# Patient Record
Sex: Male | Born: 1955
Health system: Southern US, Community
[De-identification: ages and names within clinical notes are randomized; demographics above are authoritative.]

## PROBLEM LIST (undated history)

## (undated) DIAGNOSIS — I10 Essential (primary) hypertension: Secondary | ICD-10-CM

## (undated) DIAGNOSIS — F32A Depression, unspecified: Secondary | ICD-10-CM

## (undated) DIAGNOSIS — I639 Cerebral infarction, unspecified: Secondary | ICD-10-CM

## (undated) DIAGNOSIS — E785 Hyperlipidemia, unspecified: Secondary | ICD-10-CM

## (undated) DIAGNOSIS — F329 Major depressive disorder, single episode, unspecified: Secondary | ICD-10-CM

## (undated) DIAGNOSIS — E039 Hypothyroidism, unspecified: Secondary | ICD-10-CM

## (undated) HISTORY — DX: Hyperlipidemia, unspecified: E78.5

## (undated) HISTORY — DX: Hypothyroidism, unspecified: E03.9

## (undated) HISTORY — PX: BRANCHIAL CLEFT CYST EXCISION: SUR497

## (undated) HISTORY — DX: Cerebral infarction, unspecified: I63.9

## (undated) HISTORY — DX: Essential (primary) hypertension: I10

## (undated) HISTORY — DX: Major depressive disorder, single episode, unspecified: F32.9

## (undated) HISTORY — DX: Depression, unspecified: F32.A

---

## 1998-04-11 ENCOUNTER — Ambulatory Visit (HOSPITAL_COMMUNITY): Admission: RE | Admit: 1998-04-11 | Discharge: 1998-04-11 | Payer: Self-pay | Admitting: Gastroenterology

## 2002-05-25 DIAGNOSIS — I639 Cerebral infarction, unspecified: Secondary | ICD-10-CM

## 2002-05-25 HISTORY — DX: Cerebral infarction, unspecified: I63.9

## 2002-12-18 ENCOUNTER — Encounter: Payer: Self-pay | Admitting: Emergency Medicine

## 2002-12-18 ENCOUNTER — Inpatient Hospital Stay (HOSPITAL_COMMUNITY): Admission: EM | Admit: 2002-12-18 | Discharge: 2002-12-20 | Payer: Self-pay | Admitting: Emergency Medicine

## 2002-12-19 ENCOUNTER — Encounter: Payer: Self-pay | Admitting: Internal Medicine

## 2003-02-27 ENCOUNTER — Encounter: Admission: RE | Admit: 2003-02-27 | Discharge: 2003-02-27 | Payer: Self-pay | Admitting: Internal Medicine

## 2003-04-16 ENCOUNTER — Encounter: Admission: RE | Admit: 2003-04-16 | Discharge: 2003-04-16 | Payer: Self-pay | Admitting: Internal Medicine

## 2003-06-01 ENCOUNTER — Encounter: Admission: RE | Admit: 2003-06-01 | Discharge: 2003-06-01 | Payer: Self-pay | Admitting: Internal Medicine

## 2003-10-04 ENCOUNTER — Encounter: Admission: RE | Admit: 2003-10-04 | Discharge: 2003-10-04 | Payer: Self-pay | Admitting: Internal Medicine

## 2003-10-18 ENCOUNTER — Encounter: Admission: RE | Admit: 2003-10-18 | Discharge: 2003-10-18 | Payer: Self-pay | Admitting: Internal Medicine

## 2003-11-27 ENCOUNTER — Encounter: Admission: RE | Admit: 2003-11-27 | Discharge: 2003-11-27 | Payer: Self-pay | Admitting: Internal Medicine

## 2003-12-04 ENCOUNTER — Encounter: Admission: RE | Admit: 2003-12-04 | Discharge: 2003-12-04 | Payer: Self-pay | Admitting: Internal Medicine

## 2004-06-11 ENCOUNTER — Ambulatory Visit: Payer: Self-pay | Admitting: Internal Medicine

## 2004-06-11 ENCOUNTER — Ambulatory Visit (HOSPITAL_COMMUNITY): Admission: RE | Admit: 2004-06-11 | Discharge: 2004-06-11 | Payer: Self-pay | Admitting: Internal Medicine

## 2004-07-10 ENCOUNTER — Ambulatory Visit: Payer: Self-pay | Admitting: Internal Medicine

## 2004-07-11 ENCOUNTER — Ambulatory Visit: Payer: Self-pay | Admitting: Internal Medicine

## 2004-12-04 ENCOUNTER — Ambulatory Visit: Payer: Self-pay | Admitting: Internal Medicine

## 2005-12-15 ENCOUNTER — Ambulatory Visit: Payer: Self-pay | Admitting: Internal Medicine

## 2006-05-26 DIAGNOSIS — I10 Essential (primary) hypertension: Secondary | ICD-10-CM | POA: Insufficient documentation

## 2006-05-26 DIAGNOSIS — F32A Depression, unspecified: Secondary | ICD-10-CM | POA: Insufficient documentation

## 2006-05-26 DIAGNOSIS — F329 Major depressive disorder, single episode, unspecified: Secondary | ICD-10-CM

## 2006-05-26 DIAGNOSIS — E039 Hypothyroidism, unspecified: Secondary | ICD-10-CM

## 2006-06-11 DIAGNOSIS — E785 Hyperlipidemia, unspecified: Secondary | ICD-10-CM

## 2006-06-30 ENCOUNTER — Telehealth: Payer: Self-pay | Admitting: *Deleted

## 2006-08-17 ENCOUNTER — Ambulatory Visit: Payer: Self-pay | Admitting: Internal Medicine

## 2006-08-17 ENCOUNTER — Encounter (INDEPENDENT_AMBULATORY_CARE_PROVIDER_SITE_OTHER): Payer: Self-pay | Admitting: *Deleted

## 2006-08-17 LAB — CONVERTED CEMR LAB
BUN: 13 mg/dL (ref 6–23)
CO2: 24 meq/L (ref 19–32)
Calcium: 8.6 mg/dL (ref 8.4–10.5)
Creatinine, Ser: 0.72 mg/dL (ref 0.40–1.50)
Sodium: 140 meq/L (ref 135–145)

## 2006-09-02 ENCOUNTER — Telehealth: Payer: Self-pay | Admitting: *Deleted

## 2006-09-16 ENCOUNTER — Ambulatory Visit: Payer: Self-pay | Admitting: Internal Medicine

## 2006-09-16 ENCOUNTER — Encounter (INDEPENDENT_AMBULATORY_CARE_PROVIDER_SITE_OTHER): Payer: Self-pay | Admitting: *Deleted

## 2006-09-16 ENCOUNTER — Telehealth (INDEPENDENT_AMBULATORY_CARE_PROVIDER_SITE_OTHER): Payer: Self-pay | Admitting: *Deleted

## 2006-09-16 LAB — CONVERTED CEMR LAB
Amphetamine Screen, Ur: NEGATIVE
Creatinine,U: 54.6 mg/dL
Marijuana Metabolite: NEGATIVE
Methadone: NEGATIVE
Opiates: NEGATIVE
Phencyclidine (PCP): NEGATIVE
Propoxyphene: NEGATIVE
Specific Gravity, Urine: 1.012 (ref 1.005–1.03)
pH: 6.5 (ref 5.0–8.0)

## 2006-09-30 ENCOUNTER — Encounter (INDEPENDENT_AMBULATORY_CARE_PROVIDER_SITE_OTHER): Payer: Self-pay | Admitting: *Deleted

## 2006-09-30 ENCOUNTER — Ambulatory Visit: Payer: Self-pay | Admitting: Internal Medicine

## 2006-09-30 LAB — CONVERTED CEMR LAB
BUN: 15 mg/dL (ref 6–23)
CO2: 25 meq/L (ref 19–32)
Chloride: 103 meq/L (ref 96–112)
Potassium: 4.4 meq/L (ref 3.5–5.3)

## 2006-10-27 ENCOUNTER — Telehealth: Payer: Self-pay | Admitting: *Deleted

## 2006-12-13 ENCOUNTER — Ambulatory Visit: Payer: Self-pay | Admitting: Hospitalist

## 2006-12-15 ENCOUNTER — Encounter (INDEPENDENT_AMBULATORY_CARE_PROVIDER_SITE_OTHER): Payer: Self-pay | Admitting: Internal Medicine

## 2006-12-16 ENCOUNTER — Telehealth (INDEPENDENT_AMBULATORY_CARE_PROVIDER_SITE_OTHER): Payer: Self-pay | Admitting: Pharmacy Technician

## 2006-12-17 ENCOUNTER — Telehealth (INDEPENDENT_AMBULATORY_CARE_PROVIDER_SITE_OTHER): Payer: Self-pay | Admitting: Internal Medicine

## 2007-03-07 ENCOUNTER — Telehealth: Payer: Self-pay | Admitting: *Deleted

## 2007-03-29 ENCOUNTER — Telehealth (INDEPENDENT_AMBULATORY_CARE_PROVIDER_SITE_OTHER): Payer: Self-pay | Admitting: *Deleted

## 2007-04-05 ENCOUNTER — Ambulatory Visit: Payer: Self-pay | Admitting: *Deleted

## 2007-04-05 DIAGNOSIS — B009 Herpesviral infection, unspecified: Secondary | ICD-10-CM | POA: Insufficient documentation

## 2007-04-05 LAB — CONVERTED CEMR LAB
ALT: 16 units/L (ref 0–53)
AST: 13 units/L (ref 0–37)
Albumin: 4.5 g/dL (ref 3.5–5.2)
Creatinine, Ser: 0.94 mg/dL (ref 0.40–1.50)
Total Protein: 6.6 g/dL (ref 6.0–8.3)

## 2007-10-20 ENCOUNTER — Ambulatory Visit: Payer: Self-pay | Admitting: *Deleted

## 2007-11-01 ENCOUNTER — Ambulatory Visit: Payer: Self-pay | Admitting: *Deleted

## 2007-11-02 ENCOUNTER — Encounter (INDEPENDENT_AMBULATORY_CARE_PROVIDER_SITE_OTHER): Payer: Self-pay | Admitting: *Deleted

## 2007-11-02 LAB — CONVERTED CEMR LAB
Calcium: 8.9 mg/dL (ref 8.4–10.5)
Chlamydia, Swab/Urine, PCR: NEGATIVE
Creatinine, Ser: 0.82 mg/dL (ref 0.40–1.50)
Potassium: 3.9 meq/L (ref 3.5–5.3)

## 2008-06-21 ENCOUNTER — Telehealth: Payer: Self-pay | Admitting: *Deleted

## 2008-07-03 ENCOUNTER — Ambulatory Visit: Payer: Self-pay | Admitting: *Deleted

## 2008-07-03 LAB — CONVERTED CEMR LAB
AST: 13 units/L (ref 0–37)
Alkaline Phosphatase: 71 units/L (ref 39–117)
BUN: 14 mg/dL (ref 6–23)
CO2: 25 meq/L (ref 19–32)
Chloride: 101 meq/L (ref 96–112)
Potassium: 4.7 meq/L (ref 3.5–5.3)
Sodium: 140 meq/L (ref 135–145)
Total Bilirubin: 0.4 mg/dL (ref 0.3–1.2)

## 2008-07-04 ENCOUNTER — Telehealth: Payer: Self-pay | Admitting: Licensed Clinical Social Worker

## 2008-07-09 ENCOUNTER — Ambulatory Visit: Payer: Self-pay | Admitting: *Deleted

## 2008-07-09 ENCOUNTER — Encounter: Payer: Self-pay | Admitting: Licensed Clinical Social Worker

## 2008-07-09 LAB — CONVERTED CEMR LAB
OCCULT 1: NEGATIVE
OCCULT 3: NEGATIVE

## 2008-07-26 ENCOUNTER — Encounter: Payer: Self-pay | Admitting: Licensed Clinical Social Worker

## 2008-09-10 ENCOUNTER — Telehealth: Payer: Self-pay | Admitting: *Deleted

## 2008-10-18 ENCOUNTER — Telehealth (INDEPENDENT_AMBULATORY_CARE_PROVIDER_SITE_OTHER): Payer: Self-pay | Admitting: *Deleted

## 2009-01-17 ENCOUNTER — Telehealth (INDEPENDENT_AMBULATORY_CARE_PROVIDER_SITE_OTHER): Payer: Self-pay | Admitting: Internal Medicine

## 2009-04-22 ENCOUNTER — Telehealth: Payer: Self-pay | Admitting: *Deleted

## 2009-05-08 ENCOUNTER — Telehealth (INDEPENDENT_AMBULATORY_CARE_PROVIDER_SITE_OTHER): Payer: Self-pay | Admitting: *Deleted

## 2009-06-06 ENCOUNTER — Ambulatory Visit: Payer: Self-pay | Admitting: Internal Medicine

## 2009-06-08 LAB — CONVERTED CEMR LAB
Calcium: 9.7 mg/dL (ref 8.4–10.5)
Creatinine, Ser: 0.79 mg/dL (ref 0.40–1.50)
Glucose, Bld: 104 mg/dL — ABNORMAL HIGH (ref 70–99)
Sodium: 143 meq/L (ref 135–145)
TSH: 0.405 microintl units/mL (ref 0.350–4.5)

## 2009-06-17 ENCOUNTER — Telehealth: Payer: Self-pay | Admitting: Internal Medicine

## 2009-06-18 ENCOUNTER — Ambulatory Visit: Payer: Self-pay | Admitting: Internal Medicine

## 2009-06-18 LAB — CONVERTED CEMR LAB: OCCULT 3: NEGATIVE

## 2009-07-12 ENCOUNTER — Ambulatory Visit: Payer: Self-pay | Admitting: Internal Medicine

## 2009-07-22 ENCOUNTER — Telehealth: Payer: Self-pay | Admitting: Internal Medicine

## 2009-07-23 ENCOUNTER — Telehealth: Payer: Self-pay | Admitting: Internal Medicine

## 2009-08-09 ENCOUNTER — Telehealth (INDEPENDENT_AMBULATORY_CARE_PROVIDER_SITE_OTHER): Payer: Self-pay | Admitting: Internal Medicine

## 2009-08-14 ENCOUNTER — Ambulatory Visit: Payer: Self-pay | Admitting: Infectious Diseases

## 2009-09-24 ENCOUNTER — Encounter (INDEPENDENT_AMBULATORY_CARE_PROVIDER_SITE_OTHER): Payer: Self-pay | Admitting: Internal Medicine

## 2009-11-12 ENCOUNTER — Encounter: Payer: Self-pay | Admitting: Internal Medicine

## 2009-12-10 ENCOUNTER — Ambulatory Visit: Payer: Self-pay | Admitting: Internal Medicine

## 2009-12-10 LAB — CONVERTED CEMR LAB

## 2009-12-11 ENCOUNTER — Encounter: Payer: Self-pay | Admitting: Internal Medicine

## 2009-12-11 LAB — CONVERTED CEMR LAB
CO2: 25 meq/L (ref 19–32)
Chloride: 102 meq/L (ref 96–112)
Creatinine, Ser: 0.82 mg/dL (ref 0.40–1.50)
Free T4: 1.32 ng/dL (ref 0.80–1.80)
Potassium: 3.9 meq/L (ref 3.5–5.3)

## 2009-12-13 ENCOUNTER — Encounter: Payer: Self-pay | Admitting: Internal Medicine

## 2009-12-16 ENCOUNTER — Encounter: Payer: Self-pay | Admitting: Internal Medicine

## 2010-05-11 ENCOUNTER — Emergency Department (HOSPITAL_COMMUNITY)
Admission: EM | Admit: 2010-05-11 | Discharge: 2010-05-11 | Payer: Self-pay | Source: Home / Self Care | Admitting: Emergency Medicine

## 2010-05-28 ENCOUNTER — Ambulatory Visit: Admission: RE | Admit: 2010-05-28 | Discharge: 2010-05-28 | Payer: Self-pay | Source: Home / Self Care

## 2010-06-12 ENCOUNTER — Ambulatory Visit: Admission: RE | Admit: 2010-06-12 | Discharge: 2010-06-12 | Payer: Self-pay | Source: Home / Self Care

## 2010-06-12 LAB — CONVERTED CEMR LAB
Cholesterol: 269 mg/dL — ABNORMAL HIGH (ref 0–200)
HDL: 44 mg/dL (ref >39)
Total CHOL/HDL Ratio: 6.1
VLDL: 21 mg/dL (ref 0–40)

## 2010-06-26 NOTE — Progress Notes (Signed)
Summary: medication/gp  Phone Note Outgoing Call   Summary of Call: Received fax from [facility] about pt requesting a lower dose of Fluoxetine.  I called the pt.; he said he thinks 40mg  was too strong -  increased nervousness,decreased sex drive, memory loss and invol. muscle movemt. He said he was taking 20mg .  He does not take it at all now.  And that his therapist was upset with him when he did stopped taking it  and wants him to restart it.  He has an appt. 3/24 and will talk to you then. He's very concerned about memory loss.  Thanks   Initial call taken by: Chinita Pester RN,  August 09, 2009 1:47 PM  Follow-up for Phone Call        I will discuss this with him at his appointment next week. Follow-up by: Brooks Sailors MD,  August 09, 2009 6:17 PM

## 2010-06-26 NOTE — Miscellaneous (Signed)
Summary: adding fluoxetine to medications       Prescriptions: FLUOXETINE HCL 20 MG CAPS (FLUOXETINE HCL) Take 1 tablet by mouth once a day  #30 x 5   Entered and Authorized by:   Bethel Born MD   Signed by:   Bethel Born MD on 11/12/2009   Method used:   Electronically to        [facility] [id number] (retail)       [address]       [place], [place]  [address]       Ph: [phone]       Fax: [phone]   RxID:   [id number]

## 2010-06-26 NOTE — Progress Notes (Signed)
Summary: Refill/gh  Phone Note Refill Request Message from:  Fax from Pharmacy on June 17, 2009 10:53 AM  Refills Requested: Medication #1:  EQ LORATADINE 10 MG  TABS Take 1 tablet by mouth once a day   Last Refilled: 04/28/2009 Last labs were 06/16/2009 and last labs were 06/06/2009.   Method Requested: Electronic Initial call taken by: Angelina Ok RN,  June 17, 2009 10:54 AM    Prescriptions: EQ LORATADINE 10 MG  TABS (LORATADINE) Take 1 tablet by mouth once a day  #30 x 11   Entered and Authorized by:   Blanch Media MD   Signed by:   Blanch Media MD on 06/17/2009   Method used:   Electronically to        [facility] [id number] (retail)       [address] [place], [place]  [address]       Ph: [phone]       Fax: [phone]   RxID:   [id number]

## 2010-06-26 NOTE — Miscellaneous (Signed)
Summary: DDS  DDS   Imported By: Louretta Parma 12/18/2009 15:20:38  _____________________________________________________________________  External Attachment:    Type:   Image     Comment:   External Document
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
## 2010-06-26 NOTE — Progress Notes (Signed)
Summary: med refill/gp  Phone Note Refill Request Message from:  Fax from Pharmacy on August 09, 2009 2:22 PM  Request refill on Lorazepam 0.5mg - take 1 tablet every 8 hrs.as needed for anxiety. #90 Last refill - 02/18/09.   Method Requested: Telephone to Pharmacy Initial call taken by: Chinita Pester RN,  August 09, 2009 2:23 PM  Follow-up for Phone Call        Needs to be called in.  Thanks. Follow-up by: Brooks Sailors MD,  August 09, 2009 6:15 PM  Additional Follow-up for Phone Call Additional follow up Details #1::        Rx called to pharmacy - [facility]. Additional Follow-up by: Chinita Pester RN,  August 12, 2009 9:05 AM    New/Updated Medications: ATIVAN 0.5 MG TABS (LORAZEPAM) Take 1 tablet by mouth every 8 hrs as needed for anxiety Prescriptions: ATIVAN 0.5 MG TABS (LORAZEPAM) Take 1 tablet by mouth every 8 hrs as needed for anxiety  #90 x 0   Entered and Authorized by:   Brooks Sailors MD   Signed by:   Brooks Sailors MD on 08/09/2009   Method used:   Telephoned to ...       [facility] [id number] (retail)       [address] [place], [place]  [address]       Ph: [phone]       Fax: [phone]   RxID:   [id number]

## 2010-06-26 NOTE — Progress Notes (Signed)
Summary: Refill/gh  Phone Note Refill Request Message from:  Fax from Pharmacy on July 22, 2009 9:50 AM  Refills Requested: Medication #1:  EQ LORATADINE 10 MG  TABS Take 1 tablet by mouth once a day   Last Refilled: 04/28/2009 Last OV was 07/12/2009 and last labs were 06-06-2009.   Method Requested: Electronic Initial call taken by: Angelina Ok RN,  July 22, 2009 9:51 AM  Follow-up for Phone Call        Rx denied because it was already re-writtenon 06/17/09 with 11 refills.  Please call [name] and remind him that the medication has already been re-written and that refills are available for his use.  Thank You. Follow-up by: Doneen Poisson MD,  July 23, 2009 9:01 AM  Additional Follow-up for Phone Call Additional follow up Details #1::        Rx denial called/faxed to pharmacy Additional Follow-up by: Angelina Ok RN,  July 23, 2009 11:36 AM     Appended Document: Refill/gh Call to pharmacy.  Refills had been placed on file.  Angelina Ok, RN.  They will do the  refill for pt.  Angelina Ok, RN July 23, 2009 11:56 AM

## 2010-06-26 NOTE — Assessment & Plan Note (Signed)
Summary: est-2 wk recheck/ch   Vital Signs:  Patient profile:   55 year old male Height:      68 inches (172.72 cm) Weight:      228.2 pounds (103.73 kg) BMI:     34.82 Temp:     97.2 degrees F (36.22 degrees C) oral Pulse rate:   64 / minute BP sitting:   123 / 80  (left arm)  Vitals Entered By: Stanton Kidney Ditzler RN (June 12, 2010 8:32 AM) Is Patient Diabetic? No Pain Assessment Patient in pain? yes     Location: jointssharp Intensity: 3 Onset of pain  long time Nutritional Status BMI of > 30 = obese Nutritional Status Detail appetite good  Have you ever been in a relationship where you felt threatened, hurt or afraid?denies   Does patient need assistance? Functional Status Self care Ambulation Normal Comments Fasting for labs, no chol med at pharmacy, invol muscle movement - getting worse,occ pain upper left chest and left arm, bump left side of head, weak, tired and confused.   Primary Care Provider:  Bethel Born MD   History of Present Illness: 55 y/o m comes to the clinic for HTN, HLD, Hypothyrodism comes for follow up  He was in the ED 1 monht agofor blurry vision and high blood preassure. He was not given any medication aat that time. He has not had any furhter symtpms. Was seen in the clinic after that  HTN- well controlled on current regimen. No episdes of dizziness, light headed ness or hypotension. complaint with current medication. no perceived side effects at this time.    Hypothyrodism- compliant with meds. no fatigue, cold intolerence, etc    Depression History:      The patient denies a depressed mood most of the day and a diminished interest in his usual daily activities.         Preventive Screening-Counseling & Management  Alcohol-Tobacco     Alcohol drinks/day: 0     Smoking Status: quit     Year Quit: 22 years ago  Caffeine-Diet-Exercise     Does Patient Exercise: no  Current Medications (verified): 1)  Synthroid 100 Mcg Tabs  (Levothyroxine Sodium) .... Take 1 Tablet By Mouth Once A Day 2)  Study Drug - Niacin Vs Placebo 3)  Hydrochlorothiazide 12.5 Mg  Tabs (Hydrochlorothiazide) .... Take 1 Tablet By Mouth Once A Day 4)  Aspirin 81 Mg Chew (Aspirin) .... Take 1 Tablet By Mouth Once A Day 5)  Nasonex 50 Mcg/act  Susp (Mometasone Furoate) .... Use 2 Sprays Ins Each Nostril Once A Day. 6)  Eq Loratadine 10 Mg  Tabs (Loratadine) .... Take 1 Tablet By Mouth Once A Day 7)  Ativan 0.5 Mg Tabs (Lorazepam) .... Take 1 Tablet By Mouth Every 8 Hrs As Needed For Anxiety 8)  Prozac 20 Mg Caps (Fluoxetine Hcl) .... Take 1 Tab By Mouth At Bedtime 9)  Meloxicam 7.5 Mg Tabs (Meloxicam) .... Take 1 Tablet By Mouth Once A Day 10)  Lipitor 20 Mg Tabs (Atorvastatin Calcium) .... Take 1 Tablet By Mouth Once A Day  Allergies: 1)  Novocain  Review of Systems  The patient denies anorexia, fever, weight loss, weight gain, vision loss, decreased hearing, hoarseness, chest pain, syncope, dyspnea on exertion, peripheral edema, prolonged cough, headaches, hemoptysis, abdominal pain, melena, hematochezia, severe indigestion/heartburn, hematuria, incontinence, genital sores, muscle weakness, suspicious skin lesions, transient blindness, difficulty walking, depression, unusual weight change, abnormal bleeding, enlarged lymph nodes, angioedema, breast masses, and testicular  masses.    Physical Exam  General:  Gen: VS reveiwed, Alert, well developed, nodistress ENT: mucous membranes pink & moist. No abnormal finds in ear and nose. CVC:S1 S2 , no murmurs, no abnormal heart sounds. Lungs: Clear to auscultation B/L. No wheezes, crackles or other abnormal sounds Abdomen: soft, non distended, no tender. Normal Bowel sounds EXT: no pitting edema, no engorged veins, Pulsations normal  Neuro:alert, oriented *3, cranial nerved 2-12 intact, strenght normal in all  extremities, senstations normal to light touch.      Impression &  Recommendations:  Problem # 1:  TRIGGER FINGER (ICD-727.03) improving with mobic. has not been to orthopedic, wants to continue with mobic for now.   Problem # 2:  HYPERLIPIDEMIA (ICD-272.4)  medications changes at last visit, but has not acutually started taking lipitor yet.  will check FLP todayand repeat in 3 mnhts  His updated medication list for this problem includes:    Lipitor 20 Mg Tabs (Atorvastatin calcium) .Marland Kitchen... Take 1 tablet by mouth once a day  Labs Reviewed: SGOT: 13 (07/03/2008)   SGPT: 13 (07/03/2008)  Prior 10 Yr Risk Heart Disease: Not enough information (08/17/2006)  Orders: T-Lipid Profile ([id number])  Problem # 3:  HYPOTHYROIDISM (ICD-244.9) controlled  His updated medication list for this problem includes:    Synthroid 100 Mcg Tabs (Levothyroxine sodium) .Marland Kitchen... Take 1 tablet by mouth once a day  Labs Reviewed: TSH: 1.341 (12/11/2009)     Problem # 4:  HYPERTENSION (ICD-401.9) well controlled. continue  His updated medication list for this problem includes:    Hydrochlorothiazide 12.5 Mg Tabs (Hydrochlorothiazide) .Marland Kitchen... Take 1 tablet by mouth once a day  BP today: 123/80 Prior BP: 122/78 (05/28/2010)  Prior 10 Yr Risk Heart Disease: Not enough information (08/17/2006)  Labs Reviewed: K+: 3.9 (12/11/2009) Creat: : 0.82 (12/11/2009)     Problem # 5:  DEPRESSION (ICD-311) well controlled with prozac. has a psychologist.  The following medications were removed from the medication list:    Fluoxetine Hcl 20 Mg Caps (Fluoxetine hcl) .Marland Kitchen... Take 1 tablet by mouth once a day His updated medication list for this problem includes:    Ativan 0.5 Mg Tabs (Lorazepam) .Marland Kitchen... Take 1 tablet by mouth every 8 hrs as needed for anxiety    Prozac 20 Mg Caps (Fluoxetine hcl) .Marland Kitchen... Take 1 tab by mouth at bedtime  Complete Medication List: 1)  Synthroid 100 Mcg Tabs (Levothyroxine sodium) .... Take 1 tablet by mouth once a day 2)  Study Drug - Niacin Vs Placebo   3)  Hydrochlorothiazide 12.5 Mg Tabs (Hydrochlorothiazide) .... Take 1 tablet by mouth once a day 4)  Aspirin 81 Mg Chew (Aspirin) .... Take 1 tablet by mouth once a day 5)  Nasonex 50 Mcg/act Susp (Mometasone furoate) .... Use 2 sprays ins each nostril once a day. 6)  Eq Loratadine 10 Mg Tabs (Loratadine) .... Take 1 tablet by mouth once a day 7)  Ativan 0.5 Mg Tabs (Lorazepam) .... Take 1 tablet by mouth every 8 hrs as needed for anxiety 8)  Prozac 20 Mg Caps (Fluoxetine hcl) .... Take 1 tab by mouth at bedtime 9)  Meloxicam 7.5 Mg Tabs (Meloxicam) .... Take 1 tablet by mouth once a day 10)  Lipitor 20 Mg Tabs (Atorvastatin calcium) .... Take 1 tablet by mouth once a day  Patient Instructions: 1)  Please schedule a follow-up appointment in 6 months. Come fasting at that.  Prescriptions: LIPITOR 20 MG TABS (ATORVASTATIN CALCIUM) Take 1 tablet  by mouth once a day  #30 x 1   Entered and Authorized by:   Bethel Born MD   Signed by:   Bethel Born MD on 06/12/2010   Method used:   Electronically to        [facility] [id number] (retail)       [address]       [place], [place]  [address]       Ph: [phone]       Fax: [phone]   RxID:   [id number]    Orders Added: 1)  Est. Patient Level IV [63875] 2)  T-Lipid Profile [id number]   Process Orders Check Orders Results:     [facility]: ABN not required for this insurance Tests Sent for requisitioning (June 12, 2010 2:01 PM):     06/12/2010: [facility] -- T-Lipid Profile [id number] (signed)     Prevention & Chronic Care Immunizations   Influenza vaccine: Not documented   Influenza vaccine deferral: Refused  (07/12/2009)    Tetanus booster: Not documented   Td booster deferral: Refused  (12/10/2009)    Pneumococcal vaccine: Not documented   Pneumococcal vaccine deferral: Refused  (12/10/2009)  Colorectal Screening   Hemoccult: Not  documented   Hemoccult action/deferral: Ordered  (06/06/2009)    Colonoscopy: Not documented   Colonoscopy action/deferral: Refused  (06/12/2010)  Other Screening   PSA: Not documented   PSA action/deferral: Discussion deferred  (12/10/2009)   Smoking status: quit  (06/12/2010)  Lipids   Total Cholesterol: Not documented   Lipid panel action/deferral: Deferred   LDL: Not documented   LDL Direct: Not documented   HDL: Not documented   Triglycerides: Not documented    SGOT (AST): 13  (07/03/2008)   SGPT (ALT): 13  (07/03/2008)   Alkaline phosphatase: 71  (07/03/2008)   Total bilirubin: 0.4  (07/03/2008)    Lipid flowsheet reviewed?: Yes   Progress toward LDL goal: Unchanged  Hypertension   Last Blood Pressure: 123 / 80  (06/12/2010)   Serum creatinine: 0.82  (12/11/2009)   BMP action: Ordered   Serum potassium 3.9  (12/11/2009)    Hypertension flowsheet reviewed?: Yes   Progress toward BP goal: At goal  Self-Management Support :   Personal Goals (by the next clinic visit) :      Personal blood pressure goal: 140/90  (07/12/2009)     Personal LDL goal: 100  (07/12/2009)    Patient will work on the following items until the next clinic visit to reach self-care goals:     Medications and monitoring: take my medicines every day, check my blood pressure, bring all of my medications to every visit, weigh myself weekly  (06/12/2010)     Eating: eat more vegetables, use fresh or frozen vegetables, eat foods that are low in salt, eat baked foods instead of fried foods, eat fruit for snacks and desserts, limit or avoid alcohol  (06/12/2010)     Activity: take a 30 minute walk every day, park at the far end of the parking lot  (06/12/2010)     Other: working in yard  (06/06/2009)    Hypertension self-management support: Written self-care plan, Education handout, Resources for patients handout  (06/12/2010)   Hypertension self-care plan printed.   Hypertension education handout  printed    Lipid self-management support: Written self-care plan, Education handout, Resources for patients handout  (06/12/2010)   Lipid self-care plan printed.  Lipid education handout printed      Resource handout printed.

## 2010-06-26 NOTE — Progress Notes (Signed)
Summary: Refill/gh  Phone Note Refill Request Message from:  Fax from Pharmacy on July 23, 2009 11:37 AM  Refills Requested: Medication #1:  SYNTHROID 100 MCG TABS Take 1 tablet by mouth once a day   Dosage confirmed as above?Dosage Confirmed   Supply Requested: 1 year   Last Refilled: 06/15/2009 Last labs were  06/06/2009.  Last office vist was 07/12/2009   Method Requested: Electronic Initial call taken by: Angelina Ok RN,  July 23, 2009 11:38 AM    Prescriptions: SYNTHROID 100 MCG TABS (LEVOTHYROXINE SODIUM) Take 1 tablet by mouth once a day  #30 x 11   Entered and Authorized by:   Doneen Poisson MD   Signed by:   Doneen Poisson MD on 07/23/2009   Method used:   Electronically to        [facility] [id number] (retail)       [address] [place], [place]  [address]       Ph: [phone]       Fax: [phone]   RxID:   [id number]

## 2010-06-26 NOTE — Assessment & Plan Note (Signed)
Summary: RA/NEEDS ROUTINE CHECKUP/CH   Vital Signs:  Patient profile:   55 year old male Height:      68 inches (172.72 cm) Weight:      220.9 pounds (100.41 kg) BMI:     33.71 Temp:     98.4 degrees F (36.89 degrees C) oral Pulse rate:   62 / minute BP sitting:   124 / 80  (right arm) Cuff size:   large  Vitals Entered By: Cynda Familia Duncan Dull) (December 10, 2009 2:17 PM) CC: pt c/o right hand/thumb pain x , fingers getting "stuck" on left hand, "sinus HA" x 3 days, Depression Is Patient Diabetic? No Pain Assessment Patient in pain? yes     Location: right thumb Intensity: varies Type: sharp Onset of pain  constant x Nutritional Status BMI of > 30 = obese  Have you ever been in a relationship where you felt threatened, hurt or afraid?No   Does patient need assistance? Functional Status Self care Ambulation Normal   Primary Care Provider:  Bethel Born MD  CC:  pt c/o right hand/thumb pain x , fingers getting "stuck" on left hand, "sinus HA" x 3 days, and Depression.  History of Present Illness: 55 y/o man with PMH of anxiety (on ativan),depression (on prozac), , HTN and HLD and hypothyroidism comes for a routine follow up visit  Complains of pain in the right thumb base on opposition of thumb. also some swelling. reduced rang of motion of the thumb on the right. Going on since 1 month. no meds taken yet. no similar complaints in past. has some pain in the pip joints of the other hand as well.   Depression History:      The patient is having a depressed mood most of the day but denies diminished interest in his usual daily activities.        Suicide risk questions reveal that he does not feel like life is worth living, he wishes that he were dead, and he has thought about ending his life.        Comments:  Pt states he has thought about ending his life his entire life, but it's not something he is going to do today.   Preventive Screening-Counseling &  Management  Alcohol-Tobacco     Smoking Status: quit     Year Quit: 22 years ago  Current Medications (verified): 1)  Synthroid 100 Mcg Tabs (Levothyroxine Sodium) .... Take 1 Tablet By Mouth Once A Day 2)  Zocor 40 Mg Tabs (Simvastatin) .... Take 1 Tablet By Mouth Once A Day 3)  Study Drug - Niacin Vs Placebo 4)  Hydrochlorothiazide 12.5 Mg  Tabs (Hydrochlorothiazide) .... Take 1 Tablet By Mouth Once A Day 5)  Aspirin Buffered 325 Mg  Tabs (Aspirin Buff(Mgcarb-Alaminoac)) .... Take 1 Tablet By Mouth Once A Day 6)  Nasonex 50 Mcg/act  Susp (Mometasone Furoate) .... Use 2 Sprays Ins Each Nostril Once A Day. 7)  Eq Loratadine 10 Mg  Tabs (Loratadine) .... Take 1 Tablet By Mouth Once A Day 8)  Carbamazepine 200 Mg Tabs (Carbamazepine) .... Take 1 Tablet By Mouth Two Times A Day 9)  Ativan 0.5 Mg Tabs (Lorazepam) .... Take 1 Tablet By Mouth Every 8 Hrs As Needed For Anxiety 10)  Prozac 20 Mg Caps (Fluoxetine Hcl) .... Take 1 Tab By Mouth At Bedtime 11)  Fluoxetine Hcl 20 Mg Caps (Fluoxetine Hcl) .... Take 1 Tablet By Mouth Once A Day 12)  Ibuprofen  400 Mg Tabs (Ibuprofen) .... Take 1 Tablet By Mouth Two Times A Day For 1 Week, Then As Needed  Allergies: 1)  Novocain  Review of Systems  The patient denies anorexia, fever, weight loss, weight gain, vision loss, decreased hearing, hoarseness, chest pain, syncope, dyspnea on exertion, peripheral edema, prolonged cough, headaches, hemoptysis, abdominal pain, melena, hematochezia, severe indigestion/heartburn, hematuria, incontinence, genital sores, muscle weakness, suspicious skin lesions, transient blindness, difficulty walking, depression, unusual weight change, abnormal bleeding, enlarged lymph nodes, angioedema, breast masses, and testicular masses.    Physical Exam  General:  alert, well-developed, well-nourished, and well-hydrated.   Head:  normocephalic and atraumatic.   Eyes:  vision grossly intact, pupils equal, pupils round, and  pupils reactive to light.   Ears:  R ear normal and L ear normal.   Nose:  no external deformity.   Mouth:  good dentition and no gingival abnormalities.   Neck:  supple, full ROM, and no masses.   Chest Wall:  no deformities and no tenderness.   Lungs:  normal respiratory effort, no intercostal retractions, no accessory muscle use, normal breath sounds, no dullness, no crackles, and no wheezes.   Heart:  normal rate, regular rhythm, no murmur, no gallop, and no rub.   Abdomen:  soft, non-tender, normal bowel sounds, no distention, and no masses.   Msk:  Right thenar eminence looks a little swollen compared to the other side, ttp on the thenar eminence, unable to oppose the thum towards the little finger,no redness or warmth or other signs of inflammation, no other joints or neurovascular component affected,  elft hand looks normal on PE Pulses:  dorsalis pedis pulses normal bilaterally  Extremities:  no edema Neurologic:  OrientedX3, cranial nerver 2-12 intact,strength good in all extremities, sensations normal to light touch, reflexes 2+ b/l, gait normal    Impression & Recommendations:  Problem # 1:  THUMB PAIN, RIGHT (ICD-729.5) No pain in anantomical snuff box and no inflammatory signs. Will treat as tendonitis with thuumb spica and NSAIDS for 2 weeks and reassess. IF still not better, will refer to [facility] or get furhter imaging.   Problem # 2:  HYPERLIPIDEMIA (ICD-272.4) We are not checkign FLP as patient gets it checked at [facility] where he is enrolled in a study trial.   His updated medication list for this problem includes:    Zocor 40 Mg Tabs (Simvastatin) .Marland Kitchen... Take 1 tablet by mouth once a day  Problem # 3:  HYPOTHYROIDISM (ICD-244.9) will recheck TSH and free T4 His updated medication list for this problem includes:    Synthroid 100 Mcg Tabs (Levothyroxine sodium) .Marland Kitchen... Take 1 tablet by mouth once a day  Orders: T-TSH ([id number]) T-T4, Free [phone])  Labs  Reviewed: TSH: 0.405 (06/06/2009)     Problem # 4:  HYPERTENSION (ICD-401.9) controlled.   His updated medication list for this problem includes:    Hydrochlorothiazide 12.5 Mg Tabs (Hydrochlorothiazide) .Marland Kitchen... Take 1 tablet by mouth once a day  Orders: T-Basic Metabolic Panel [id number])  BP today: 124/80 Prior BP: 110/72 (08/14/2009)  Prior 10 Yr Risk Heart Disease: Not enough information (08/17/2006)  Labs Reviewed: K+: 4.0 (06/06/2009) Creat: : 0.79 (06/06/2009)     Problem # 5:  DEPRESSION (ICD-311) stable on current meds.  His updated medication list for this problem includes:    Ativan 0.5 Mg Tabs (Lorazepam) .Marland Kitchen... Take 1 tablet by mouth every 8 hrs as needed for anxiety    Prozac 20 Mg Caps (Fluoxetine hcl) .Marland Kitchen... Take  1 tab by mouth at bedtime    Fluoxetine Hcl 20 Mg Caps (Fluoxetine hcl) .Marland Kitchen... Take 1 tablet by mouth once a day  Problem # 6:  Preventive Health Care (ICD-V70.0) REfusing all [facility] aspects of care due to pain. will reempasize at next visit.   Complete Medication List: 1)  Synthroid 100 Mcg Tabs (Levothyroxine sodium) .... Take 1 tablet by mouth once a day 2)  Zocor 40 Mg Tabs (Simvastatin) .... Take 1 tablet by mouth once a day 3)  Study Drug - Niacin Vs Placebo  4)  Hydrochlorothiazide 12.5 Mg Tabs (Hydrochlorothiazide) .... Take 1 tablet by mouth once a day 5)  Aspirin Buffered 325 Mg Tabs (Aspirin buff(mgcarb-alaminoac)) .... Take 1 tablet by mouth once a day 6)  Nasonex 50 Mcg/act Susp (Mometasone furoate) .... Use 2 sprays ins each nostril once a day. 7)  Eq Loratadine 10 Mg Tabs (Loratadine) .... Take 1 tablet by mouth once a day 8)  Carbamazepine 200 Mg Tabs (Carbamazepine) .... Take 1 tablet by mouth two times a day 9)  Ativan 0.5 Mg Tabs (Lorazepam) .... Take 1 tablet by mouth every 8 hrs as needed for anxiety 10)  Prozac 20 Mg Caps (Fluoxetine hcl) .... Take 1 tab by mouth at bedtime 11)  Fluoxetine Hcl 20 Mg Caps (Fluoxetine hcl) .... Take 1  tablet by mouth once a day 12)  Ibuprofen 400 Mg Tabs (Ibuprofen) .... Take 1 tablet by mouth two times a day for 1 week, then as needed  Patient Instructions: 1)  Please schedule a follow-up appointment in 3 months. Come earlier (2 weeks) if your pain is not releived with the current medicines.  Prescriptions: IBUPROFEN 400 MG TABS (IBUPROFEN) Take 1 tablet by mouth two times a day for 1 week, then as needed  #30 x 0   Entered and Authorized by:   Bethel Born MD   Signed by:   Bethel Born MD on 12/10/2009   Method used:   Electronically to        [facility] [id number] (retail)       [address]       [place], [place]  [address]       Ph: [phone]       Fax: [phone]   RxID:   [id number]  Process Orders Check Orders Results:     [facility]: ABN not required for this insurance Tests Sent for requisitioning (December 10, 2009 7:49 PM):     12/10/2009: [facility] -- T-Basic Metabolic Panel [phone] (signed)     12/10/2009: [facility] -- T-TSH [phone] (signed)     12/10/2009: [facility] -- T-T4, [place] [[id number]] (signed)    Prevention & Chronic Care Immunizations   Influenza vaccine: Not documented   Influenza vaccine deferral: Refused  (07/12/2009)    Tetanus booster: Not documented   Td booster deferral: Refused  (12/10/2009)    Pneumococcal vaccine: Not documented   Pneumococcal vaccine deferral: Refused  (12/10/2009)  Colorectal Screening   Hemoccult: Not documented   Hemoccult action/deferral: Ordered  (06/06/2009)    Colonoscopy: Not documented   Colonoscopy action/deferral: GI referral  (12/10/2009)  Other Screening   PSA: Not documented   PSA action/deferral: Discussion deferred  (12/10/2009)  Reports requested:   Last colonoscopy report requested.  Smoking status: quit  (12/10/2009)  Lipids   Total Cholesterol: Not documented   Lipid  panel action/deferral: Deferred  LDL: Not documented   LDL Direct: Not documented   HDL: Not documented   Triglycerides: Not documented    SGOT (AST): 13  (07/03/2008)   SGPT (ALT): 13  (07/03/2008)   Alkaline phosphatase: 71  (07/03/2008)   Total bilirubin: 0.4  (07/03/2008)    Lipid flowsheet reviewed?: Yes   Progress toward LDL goal: Unchanged  Hypertension   Last Blood Pressure: 124 / 80  (12/10/2009)   Serum creatinine: 0.79  (06/06/2009)   BMP action: Ordered   Serum potassium 4.0  (06/06/2009)    Hypertension flowsheet reviewed?: Yes   Progress toward BP goal: At goal  Self-Management Support :   Personal Goals (by the next clinic visit) :      Personal blood pressure goal: 140/90  (07/12/2009)     Personal LDL goal: 100  (07/12/2009)    Hypertension self-management support: Written self-care plan, Education handout, Resources for patients handout  (08/14/2009)    Lipid self-management support: Written self-care plan, Education handout, Resources for patients handout  (08/14/2009)    Nursing Instructions: Request report of last colonoscopy

## 2010-06-26 NOTE — Assessment & Plan Note (Signed)
Summary: REASSIGNED NEW TO DR/CFB   Vital Signs:  Patient profile:   55 year old male Height:      68 inches Weight:      219.0 pounds BMI:     33.42 Temp:     97.6 degrees F oral Pulse rate:   79 / minute BP sitting:   131 / 77  (right arm)  Vitals Entered By: Filomena Jungling NT II (June 06, 2009 2:13 PM) CC: pain in jaw, Depression Is Patient Diabetic? No Pain Assessment Patient in pain? yes     Location: jaw Intensity: 9 Type: aching Nutritional Status BMI of > 30 = obese  Have you ever been in a relationship where you felt threatened, hurt or afraid?No   Does patient need assistance? Functional Status Self care Ambulation Normal   Primary Care Provider:  Manning Charity MD  CC:  pain in jaw and Depression.  History of Present Illness: Pt is a 55 yo M with HLD, h/o TIA, hypothyroidism, HTN, Depression and anxiety who is here for f/u.  He has been feeling well.  He has a pain in his jaw at the left joint.  This happens when he opens his mouth wide. The pain is sharp. It is an 8/10 at its worst.  He has lost teeth on that side.  No drainage.  No fevers.  Has not taken any meds for this pain.  Pain is described as sharp, it is worse with eating drinking cold things. He is taking HCTZ for his BP. He reports that his mood has been very good lately.  He denies SI.  He has PTSD since he was a teen.  He is feeling better off of fluoxetine which gave him some sexual side effects.  These have resolved off of this medicine.  He has had an occasional panic attack.  He stopped lorazepam as well because he thought it was making him feel worse. He denies any symptoms of hypo or hyperthyroidism. He takes his synthroid regularly. He is willing to do stool cards.    Depression History:      The patient denies a depressed mood most of the day and a diminished interest in his usual daily activities.         Preventive Screening-Counseling & Management  Alcohol-Tobacco     Smoking  Status: quit     Year Quit: 22 years ago  Caffeine-Diet-Exercise     Does Patient Exercise: no  Current Medications (verified): 1)  Synthroid 100 Mcg Tabs (Levothyroxine Sodium) .... Take 1 Tablet By Mouth Once A Day 2)  Zocor 40 Mg Tabs (Simvastatin) .... Take 1 Tablet By Mouth Once A Day 3)  Study Drug - Niacin Vs Placebo 4)  Hydrochlorothiazide 12.5 Mg  Tabs (Hydrochlorothiazide) .... Take 1 Tablet By Mouth Once A Day 5)  Aspirin Buffered 325 Mg  Tabs (Aspirin Buff(Mgcarb-Alaminoac)) .... Take 1 Tablet By Mouth Once A Day 6)  Nasonex 50 Mcg/act  Susp (Mometasone Furoate) .... Use 2 Sprays Ins Each Nostril Once A Day. 7)  Eq Loratadine 10 Mg  Tabs (Loratadine) .... 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   Quit smoking  ~25 yrs ago.    Review of Systems  The patient denies anorexia, fever, weight loss, vision loss, decreased hearing, chest pain,  syncope, dyspnea on exertion, peripheral edema, prolonged cough, abdominal pain, melena, hematochezia, muscle weakness, and abnormal bleeding.    Physical Exam  General:  alert, well-developed, well-nourished, and well-hydrated.   Head:  normocephalic and atraumatic.   Eyes:  vision grossly intact, pupils equal, pupils round, pupils reactive to light, and pupils react to accomodation.   Mouth:  poor dentition.  no drainage, limited range of motion secondary to pain in right TMJ Neck:  supple and no masses.   Lungs:  normal respiratory effort, no crackles, and no wheezes.   Heart:  normal rate, regular rhythm, no murmur, no gallop, no rub, and no JVD.   Abdomen:  soft, non-tender, normal bowel sounds, no distention, no masses, no guarding, no rigidity, and no rebound tenderness.   Extremities:  trace edema bilaterally Neurologic:  alert & oriented X3,  cranial nerves II-XII intact, strength normal in all extremities, sensation intact to light touch, gait normal, DTRs symmetrical and normal, and finger-to-nose normal.   Skin:  no rashes.   Psych:  Oriented X3, memory intact for recent and remote, normally interactive, good eye contact, not anxious appearing, and not depressed appearing.     Impression & Recommendations:  Problem # 1:  HYPERTENSION (ICD-401.9) BP is at goal on current regimen.  If elevated in the future would begin with increasing dose of HCTZ.  WIll check renal function and K. His updated medication list for this problem includes:    Hydrochlorothiazide 12.5 Mg Tabs (Hydrochlorothiazide) .Marland Kitchen... Take 1 tablet by mouth once a day  Orders: T-Basic Metabolic Panel [id number])  BP today: 131/77 Prior BP: 140/85 (07/03/2008)  Prior 10 Yr Risk Heart Disease: Not enough information (08/17/2006)  Labs Reviewed: K+: 4.7 (07/03/2008) Creat: : 0.78 (07/03/2008)     Problem # 2:  HYPERLIPIDEMIA (ICD-272.4) Pt is in a research study at [facility] and so we do not check his lipids.  He is taking zocor + niacin vs. placebo. His updated medication list for this problem includes:    Zocor 40 Mg Tabs (Simvastatin) .Marland Kitchen... Take 1 tablet by mouth once a day  Labs Reviewed: SGOT: 13 (07/03/2008)   SGPT: 13 (07/03/2008)  Prior 10 Yr Risk Heart Disease: Not enough information (08/17/2006)  Problem # 3:  DEPRESSION (ICD-311) Pt reports that he stopped taking Prozac because of sexual dysfunction.  These symptoms resolved off of Prozac and he reports that his mood has been good lately, better than when he was on meds.  He denies SI/HI. The following medications were removed from the medication list:    Fluoxetine Hcl 40 Mg Caps (Fluoxetine hcl) .Marland Kitchen... Take 1 tablet by mouth once a day    Ativan 0.5 Mg Tabs (Lorazepam) .Marland Kitchen... Take 1 tablet by mouth every eight hours as needed for anxiety  Problem # 4:  ANXIETY (ICD-300.00) Pt has rarely been  anxious lately and says that he feels better not taking Ativan so he stopped it. The following medications were removed from the medication list:    Fluoxetine Hcl 40 Mg Caps (Fluoxetine hcl) .Marland Kitchen... Take 1 tablet by mouth once a day    Ativan 0.5 Mg Tabs (Lorazepam) .Marland Kitchen... Take 1 tablet by mouth every eight hours as needed for anxiety  Problem # 5:  JAW PAIN (ICD-784.92) His pain is most likely either related to his poor dentition or possibly to trigeminal neuralgia.  I would like him to see a dentist although he admits that this will be difficult.  To support trigeminal neuralgia, the  pain is worse with cold foods and it is sharp and fleeting.  We will start with empiric treatment of a possible infectious source given his poor dentition.  If this does not help his pain trigeminal neuralgia moves much higher in the differential and I would start treating with carbamazepime 200mg  by mouth two times a day.  Problem # 6:  HYPOTHYROIDISM (ICD-244.9) No symptoms of hypo or hyperthyroid lately, although pt does have some trace to 1+ pitting pretibial edema, which he says is chronic.  I will check a TSH and alter dose of synthroid as needed. His updated medication list for this problem includes:    Synthroid 100 Mcg Tabs (Levothyroxine sodium) .Marland Kitchen... Take 1 tablet by mouth once a day  Orders: T-TSH ([id number])  Problem # 7:  Preventive Health Care (ICD-V70.0) Pt does not want to undergo colonoscopy and so hemoccult cards given.  Complete Medication List: 1)  Synthroid 100 Mcg Tabs (Levothyroxine sodium) .... Take 1 tablet by mouth once a day 2)  Zocor 40 Mg Tabs (Simvastatin) .... Take 1 tablet by mouth once a day 3)  Study Drug - Niacin Vs Placebo  4)  Hydrochlorothiazide 12.5 Mg Tabs (Hydrochlorothiazide) .... Take 1 tablet by mouth once a day 5)  Aspirin Buffered 325 Mg Tabs (Aspirin buff(mgcarb-alaminoac)) .... Take 1 tablet by mouth once a day 6)  Nasonex 50 Mcg/act Susp (Mometasone furoate)  .... Use 2 sprays ins each nostril once a day. 7)  Eq Loratadine 10 Mg Tabs (Loratadine) .... Take 1 tablet by mouth once a day 8)  Amoxicillin 875 Mg Tabs (Amoxicillin) .... Take 1 tablet by mouth two times a day for 14 days.  Other Orders: T-Hemoccult Card-Multiple (take home) ([id number])  Patient Instructions: 1)  Please schedule a follow-up appointment in 1 month to get your DOT paperwork filled out. 2)  You will have somes labs checked today. I will call you if there is anything in the results that needs to be addressed.  3)  I would like you to see a dentist about your teeth as soon as you can.  I know this is a difficult thing to do financially. 4)  You will start a new medicine today: Amoxicillin 875mg  by mouth two times a day. Prescriptions: AMOXICILLIN 875 MG TABS (AMOXICILLIN) Take 1 tablet by mouth two times a day for 14 days.  #28 x 0   Entered and Authorized by:   Brooks Sailors MD   Signed by:   Brooks Sailors MD on 06/06/2009   Method used:   Electronically to        [facility] [id number] (retail)       [address] [place], [place]  [address]       Ph: [phone]       Fax: [phone]   RxID:   [id number]   Process Orders Check Orders Results:     [facility]: ABN not required for this insurance Tests Sent for requisitioning (June 06, 2009 6:52 PM):     06/06/2009: [facility] -- T-Basic Metabolic Panel [id number] (signed)     06/06/2009: [facility] -- T-TSH [id number] (signed)    Prevention & Chronic Care Immunizations   Influenza vaccine: Not documented    Tetanus booster: Not documented    Pneumococcal vaccine: Not documented  Colorectal Screening   Hemoccult: Not documented   Hemoccult action/deferral: Ordered  (  06/06/2009)    Colonoscopy: Not documented  Other Screening   PSA: Not documented   Smoking status: quit  (06/06/2009)  Lipids   Total  Cholesterol: Not documented   LDL: Not documented   LDL Direct: Not documented   HDL: Not documented   Triglycerides: Not documented    SGOT (AST): 13  (07/03/2008)   SGPT (ALT): 13  (07/03/2008)   Alkaline phosphatase: 71  (07/03/2008)   Total bilirubin: 0.4  (07/03/2008)  Hypertension   Last Blood Pressure: 131 / 77  (06/06/2009)   Serum creatinine: 0.78  (07/03/2008)   BMP action: Ordered   Serum potassium 4.7  (07/03/2008)    Hypertension flowsheet reviewed?: Yes   Progress toward BP goal: At goal  Self-Management Support :    Patient will work on the following items until the next clinic visit to reach self-care goals:     Medications and monitoring: take my medicines every day, bring all of my medications to every visit  (06/06/2009)     Eating: drink diet soda or water instead of juice or soda, use fresh or frozen vegetables, eat foods that are low in salt, eat baked foods instead of fried foods  (06/06/2009)     Other: working in yard  (06/06/2009)    Hypertension self-management support: Written self-care plan  (06/06/2009)   Hypertension self-care plan printed.    Lipid self-management support: Not documented    Nursing Instructions: Give Flu vaccine today Provide Hemoccult cards with instructions (see order)

## 2010-06-26 NOTE — Miscellaneous (Signed)
Summary: [facility]  [facility]   Imported By: Margie Billet 09/26/2009 15:16:04  _____________________________________________________________________  External Attachment:    Type:   Image     Comment:   External Document

## 2010-06-26 NOTE — Assessment & Plan Note (Signed)
Summary: ER/FU BP/ SB.   Vital Signs:  Patient profile:   55 year old male Height:      68 inches (172.72 cm) Weight:      230.9 pounds (104.95 kg) BMI:     35.24 Temp:     97.1 degrees F oral Pulse rate:   77 / minute BP sitting:   122 / 78  (right arm) Cuff size:   regular  Vitals Entered By: Chinita Pester RN (May 28, 2010 3:01 PM) CC: ED f/u for blurred vision/ high BP. Is Patient Diabetic? No Pain Assessment Patient in pain? no      Nutritional Status BMI of > 30 = obese  Have you ever been in a relationship where you felt threatened, hurt or afraid?No   Does patient need assistance? Functional Status Self care Ambulation Normal   Primary Care Provider:  Bethel Born MD  CC:  ED f/u for blurred vision/ high BP.Marland Kitchen  History of Present Illness: 55 y/o man with PMH outlined in EMR who presents for ER f/u.  Pt was seen in the ER  on 05/11/10 for blurred vision and elevated BP at home (reported 174/91), BP in ER 136/90 with normal CMET, CBC, and PE.  Ocular pressure, visual acuity, and fundoscopic exam in ER was normal.  Pt reports one additional episode of blurred vision since d/c from ER but states the his vision is normal most of the time.  He has not seen an eye doctor in approx 3-4 yrs. He notes some difficulty with reading and close vision.    Pts BP measurements at home have been averaging in the 130s but he has occasional readings in the 140-150s.  He also c/o bilateral hand hand pain and states his fingers occasionally get stuck; when this occurs, he eperiences excruciating pain.      Depression History:      The patient is having a depressed mood most of the day and has a diminished interest in his usual daily activities.        The patient denies that he feels like life is not worth living, denies that he wishes that he were dead, and denies that he has thought about ending his life.        Comments:  Takes medication and sees a therapist.States he does  have suicidal  thoughts "on and off".   Preventive Screening-Counseling & Management  Alcohol-Tobacco     Alcohol drinks/day: 0     Smoking Status: quit     Year Quit: 22 years ago  Caffeine-Diet-Exercise     Does Patient Exercise: no  Current Medications (verified): 1)  Synthroid 100 Mcg Tabs (Levothyroxine Sodium) .... Take 1 Tablet By Mouth Once A Day 2)  Study Drug - Niacin Vs Placebo 3)  Hydrochlorothiazide 12.5 Mg  Tabs (Hydrochlorothiazide) .... Take 1 Tablet By Mouth Once A Day 4)  Aspirin Buffered 325 Mg  Tabs (Aspirin Buff(Mgcarb-Alaminoac)) .... Take 1 Tablet By Mouth Once A Day 5)  Nasonex 50 Mcg/act  Susp (Mometasone Furoate) .... Use 2 Sprays Ins Each Nostril Once A Day. 6)  Eq Loratadine 10 Mg  Tabs (Loratadine) .... Take 1 Tablet By Mouth Once A Day 7)  Carbamazepine 200 Mg Tabs (Carbamazepine) .... Take 1 Tablet By Mouth Two Times A Day 8)  Ativan 0.5 Mg Tabs (Lorazepam) .... Take 1 Tablet By Mouth Every 8 Hrs As Needed For Anxiety 9)  Prozac 20 Mg Caps (Fluoxetine Hcl) .... Take 1 Tab  By Mouth At Bedtime 10)  Fluoxetine Hcl 20 Mg Caps (Fluoxetine Hcl) .... Take 1 Tablet By Mouth Once A Day 11)  Meloxicam 7.5 Mg Tabs (Meloxicam) .... Take 1 Tablet By Mouth Once A Day 12)  Lipitor 20 Mg Tabs (Atorvastatin Calcium) .... Take 1 Tablet By Mouth Once A Day  Allergies (verified): 1)  Novocain  Past History:  Past medical, surgical, family and social histories (including risk factors) reviewed for relevance to current acute and chronic problems.  Past Medical History: Reviewed history from 05/26/2006 and no changes required. Branchial cleft cyst Panic attacks Carpal tunnel syndrome Depression Hypertension Hypothyroidism Transient ischemic attack, hx of Hyperlipidemia Anxiety  Past Surgical History: Reviewed history from 05/26/2006 and no changes required. Branchial cleft cyst removal  Family History: Reviewed history from 12/13/2006 and no changes  required. Father died of stroke  Social History: Reviewed history from 08/14/2009 and no changes required. Lives with mother Drinks rarely Quit his job recently 2/2 stress. Quit smoking  ~25 yrs ago.    Physical Exam  General:  alert, well-developed, well-nourished, and well-hydrated.   Head:  normocephalic and atraumatic.   Eyes:  vision grossly intact, pupils equal, pupils round, and pupils reactive to light.  EOMI.  sclerae anicteric.  Conjunctiva without pallor or injection Mouth:  pharynx pink and moist.   Lungs:  normal respiratory effort, no intercostal retractions, no accessory muscle use, normal breath sounds, no dullness, no crackles, and no wheezes.   Heart:  normal rate, regular rhythm, no murmur, no gallop, and no rub.   Abdomen:  soft, non-tender, normal bowel sounds, no distention, and no masses.   Extremities:  trace edema in bilateral LEs.   Neurologic:  OrientedX3, cranial nerver 2-12 intact,strength good in all extremities, sensations normal to light touch, reflexes 2+ b/l, gait normal  Skin:  turgor normal.   Psych:  Oriented X3, memory intact for recent and remote, normally interactive, good eye contact, not anxious appearing, and not depressed appearing.     Impression & Recommendations:  Problem # 1:  TRIGGER FINGER (ICD-727.03) Will refer pt to ortho/sports med for steroid injection.  Will d/c ibuprofen and change to mobic for continued pain control.  Advised pt to avoid any additional NSAIDs while taking mobic and to ensure he take mobic with food.  Orders: Orthopedic Referral (Ortho)  Problem # 2:  HYPERTENSION (ICD-401.9) BP at goal.  WIll continue HCTZ at current dose.  Recent CMET obtained at ER visit reviewed and wnl; will not repeat today. His updated medication list for this problem includes:    Hydrochlorothiazide 12.5 Mg Tabs (Hydrochlorothiazide) .Marland Kitchen... Take 1 tablet by mouth once a day  BP today: 122/78 Prior BP: 124/80 (12/10/2009)  Prior  10 Yr Risk Heart Disease: Not enough information (08/17/2006)  Labs Reviewed: K+: 3.9 (12/11/2009) Creat: : 0.82 (12/11/2009)     Problem # 3:  HYPERLIPIDEMIA (ICD-272.4)  Pt is tolerating his statin without adverse effects.  He requests a change to lipitor as this has recently gone off patent and will be cheaper for him.  I agree with this; will change to lipitor 20mg  once daily and repeat FLP at 2mo f/u.  The following medications were removed from the medication list:    Zocor 40 Mg Tabs (Simvastatin) .Marland Kitchen... Take 1 tablet by mouth once a day His updated medication list for this problem includes:    Lipitor 20 Mg Tabs (Atorvastatin calcium) .Marland Kitchen... Take 1 tablet by mouth once a day  Labs Reviewed:  SGOT: 13 (07/03/2008)   SGPT: 13 (07/03/2008)  Prior 10 Yr Risk Heart Disease: Not enough information (08/17/2006)  Complete Medication List: 1)  Synthroid 100 Mcg Tabs (Levothyroxine sodium) .... Take 1 tablet by mouth once a day 2)  Study Drug - Niacin Vs Placebo  3)  Hydrochlorothiazide 12.5 Mg Tabs (Hydrochlorothiazide) .... Take 1 tablet by mouth once a day 4)  Aspirin Buffered 325 Mg Tabs (Aspirin buff(mgcarb-alaminoac)) .... Take 1 tablet by mouth once a day 5)  Nasonex 50 Mcg/act Susp (Mometasone furoate) .... Use 2 sprays ins each nostril once a day. 6)  Eq Loratadine 10 Mg Tabs (Loratadine) .... Take 1 tablet by mouth once a day 7)  Carbamazepine 200 Mg Tabs (Carbamazepine) .... Take 1 tablet by mouth two times a day 8)  Ativan 0.5 Mg Tabs (Lorazepam) .... Take 1 tablet by mouth every 8 hrs as needed for anxiety 9)  Prozac 20 Mg Caps (Fluoxetine hcl) .... Take 1 tab by mouth at bedtime 10)  Fluoxetine Hcl 20 Mg Caps (Fluoxetine hcl) .... Take 1 tablet by mouth once a day 11)  Meloxicam 7.5 Mg Tabs (Meloxicam) .... Take 1 tablet by mouth once a day 12)  Lipitor 20 Mg Tabs (Atorvastatin calcium) .... Take 1 tablet by mouth once a day  Patient Instructions: 1)  Please schedule a  follow-up appointment in 2 weeks- 1 month to discuss your other health concerns. 2)  Set up a time to meet with Rudell Cobb to talk about your orange card. 3)  Stop taking Ibuprofen and avoid any aleve, ibuprofen, motrin, advil, or other NSAID type drugs while you are taking meloxicam. 4)  Meloxicam is a new medicine for arthritis and pain.  Use as directed and take with food. 5)  We will refer you to an orthopedic doctor for your trigger finger. Prescriptions: LIPITOR 20 MG TABS (ATORVASTATIN CALCIUM) Take 1 tablet by mouth once a day  #30 x 1   Entered and Authorized by:   Nelda Bucks DO   Signed by:   Nelda Bucks DO on 05/28/2010   Method used:   Electronically to        [facility] [id number] (retail)       [address] [place], [place]  [address]       Ph: [phone]       Fax: [phone]   RxID:   [id number] ZOCOR 40 MG TABS (SIMVASTATIN) Take 1 tablet by mouth once a day  #30 x 5   Entered and Authorized by:   Nelda Bucks DO   Signed by:   Nelda Bucks DO on 05/28/2010   Method used:   Electronically to        [facility] [id number] (retail)       [address] [place], [place]  [address]       Ph: [phone]       Fax: [phone]   RxID:   [id number] MELOXICAM 7.5 MG TABS (MELOXICAM) Take 1 tablet by mouth once a day  #30 x 1   Entered and Authorized by:   Nelda Bucks DO   Signed by:   Nelda Bucks DO on 05/28/2010   Method used:   Electronically to        [facility] [address] [phone]* (  retail)       [address]       [place], [place]  [address]       Ph: [phone]       Fax: [phone]   RxID:   [id number]    Orders Added: 1)  Est. Patient Level IV [84132] 2)  Orthopedic Referral [Ortho]     Prevention & Chronic Care Immunizations   Influenza vaccine: Not documented   Influenza vaccine deferral: Refused  (07/12/2009)    Tetanus booster: Not  documented   Td booster deferral: Refused  (12/10/2009)    Pneumococcal vaccine: Not documented   Pneumococcal vaccine deferral: Refused  (12/10/2009)  Colorectal Screening   Hemoccult: Not documented   Hemoccult action/deferral: Ordered  (06/06/2009)    Colonoscopy: Not documented   Colonoscopy action/deferral: GI referral  (12/10/2009)  Other Screening   PSA: Not documented   PSA action/deferral: Discussion deferred  (12/10/2009)   Smoking status: quit  (05/28/2010)  Lipids   Total Cholesterol: Not documented   Lipid panel action/deferral: Deferred   LDL: Not documented   LDL Direct: Not documented   HDL: Not documented   Triglycerides: Not documented    SGOT (AST): 13  (07/03/2008)   SGPT (ALT): 13  (07/03/2008)   Alkaline phosphatase: 71  (07/03/2008)   Total bilirubin: 0.4  (07/03/2008)  Hypertension   Last Blood Pressure: 122 / 78  (05/28/2010)   Serum creatinine: 0.82  (12/11/2009)   BMP action: Ordered   Serum potassium 3.9  (12/11/2009)  Self-Management Support :   Personal Goals (by the next clinic visit) :      Personal blood pressure goal: 140/90  (07/12/2009)     Personal LDL goal: 100  (07/12/2009)    Patient will work on the following items until the next clinic visit to reach self-care goals:     Medications and monitoring: take my medicines every day, check my blood pressure, bring all of my medications to every visit  (05/28/2010)     Eating: use fresh or frozen vegetables, eat foods that are low in salt, eat baked foods instead of fried foods  (05/28/2010)     Activity: take a 30 minute walk every day  (05/28/2010)     Other: working in yard  (06/06/2009)    Hypertension self-management support: Written self-care plan  (05/28/2010)   Hypertension self-care plan printed.    Lipid self-management support: Written self-care plan  (05/28/2010)   Lipid self-care plan printed.

## 2010-06-26 NOTE — Assessment & Plan Note (Signed)
Summary: f/u [mkj]   Vital Signs:  Patient profile:   55 year old male Height:      68 inches (172.72 cm) Weight:      218.8 pounds (99.45 kg) BMI:     33.39 Temp:     97.0 degrees F (36.11 degrees C) oral Pulse rate:   97 / minute BP sitting:   121 / 73  (right arm)  Vitals Entered By: Baxter Hire) (July 12, 2009 2:34 PM) CC: follow-up visit, Depression Pain Assessment Patient in pain? yes     Location: body aches Intensity: 5 Type: aching Onset of pain  Constant Nutritional Status BMI of > 30 = obese Nutritional Status Detail appetite is good per patient  Does patient need assistance? Functional Status Self care Ambulation Normal   Primary Care Provider:  Manning Charity MD  CC:  follow-up visit and Depression.  History of Present Illness: Pt is here with a new pain that starts in his right testicle and radiates into his abdomen.  He has been lifting a lot of heavy objects for work.  It is an achey type pain that is intermittent.  It is a 3/10.  It is worse with lifting.  Has not tried taking any meds.  It has gotten better.  It started 1 night last week. He has had more stress recently after going back to work which he started since I last saw him.  His job is the major problem.  He gets a lot of stress from his bosses.  There is a lot of yelling.  He does not want to restart meds.  He "has to separate [himself] from the job."  He does not want to leave the job because of the poor market.  He is going to try to lighten his schedule.  He denies recent SI/HI. He took Abx for his jaw which did not help, although his pain has slowly improved but not sufficiently.  Depression History:      The patient is having a depressed mood most of the day and has a diminished interest in his usual daily activities.        Comments:  not on any depression meds. Patient states that meds are not helping.   Current Medications (verified): 1)  Synthroid 100 Mcg Tabs  (Levothyroxine Sodium) .... Take 1 Tablet By Mouth Once A Day 2)  Zocor 40 Mg Tabs (Simvastatin) .... Take 1 Tablet By Mouth Once A Day 3)  Study Drug - Niacin Vs Placebo 4)  Hydrochlorothiazide 12.5 Mg  Tabs (Hydrochlorothiazide) .... Take 1 Tablet By Mouth Once A Day 5)  Aspirin Buffered 325 Mg  Tabs (Aspirin Buff(Mgcarb-Alaminoac)) .... Take 1 Tablet By Mouth Once A Day 6)  Nasonex 50 Mcg/act  Susp (Mometasone Furoate) .... Use 2 Sprays Ins Each Nostril Once A Day. 7)  Eq Loratadine 10 Mg  Tabs (Loratadine) .... Take 1 Tablet By Mouth Once A Day 8)  Carbamazepine 200 Mg Tabs (Carbamazepine) .... 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   Quit smoking  ~  25 yrs ago.    Risk Factors: Smoking Status: quit (06/06/2009)  Review of Systems  The patient denies anorexia, fever, weight loss, weight gain, vision loss, decreased hearing, chest pain, syncope, dyspnea on exertion, peripheral edema, prolonged cough, abdominal pain, melena, and hematochezia.    Physical Exam  General:  alert, well-developed, and well-nourished.   Head:  normocephalic and atraumatic.   Eyes:  vision grossly intact, pupils equal, pupils round, and pupils reactive to light.   Nose:  no external deformity.   Mouth:  very poor dentition, difficulty opening mouth fully b/c  of pain, no obvious abscess or other oral lesion Lungs:  normal respiratory effort, normal breath sounds, no crackles, and no wheezes.     Heart:  normal rate, regular rhythm, no murmur, no gallop, no rub, and no JVD.   Abdomen:  soft, non-tender, normal bowel sounds, no distention, no masses, no guarding, and no rigidity.   Genitalia:  no hydrocele, no varicocele, and no scrotal masses.  no abnormal testicular tenderness, no inguinal hernia Extremities:  no edema Neurologic:  non-focal Psych:  tearful.     Impression & Recommendations:  Problem # 1:  JAW PAIN (ICD-784.92) The antibiotics did not seem to help him.  This may be a trigeminal neuralgia as I discussed with Dr. Josem Kaufmann at last visit.  I will start him on tegretol to see if he gets any relief.  Problem # 2:  HYPERTENSION (ICD-401.9) Well controlled.  Continue home meds. His updated medication list for this problem includes:    Hydrochlorothiazide 12.5 Mg Tabs (Hydrochlorothiazide) .Marland Kitchen... Take 1 tablet by mouth once a day  Problem # 3:  ANXIETY (ICD-300.00) Pt gets a lot of stress from his job.  He does not like his boss and feels he creates a lot of unnecessary stress.  However, he does not want to quit his job b/c he does not think he could find another.  He does not currently want to go back on medications.  He has decided that he will stop working on Fridays and he hopes the extra time off will help him to deal with the stress better.  Problem # 4:  HYPERLIPIDEMIA (ICD-272.4) He is followed in a study at [facility] and we do not check his lipids here. His updated medication list for this problem includes:    Zocor 40 Mg Tabs (Simvastatin) .Marland Kitchen... Take 1 tablet by mouth once a day  Medications Added to Medication List This Visit: 1)  Carbamazepine 200 Mg Tabs (Carbamazepine) .... Take 1 tablet by mouth two times a day  Complete Medication List: 1)  Synthroid 100 Mcg Tabs (Levothyroxine sodium) .... Take 1 tablet by mouth once a day 2)  Zocor 40 Mg Tabs (Simvastatin) .... Take 1 tablet by mouth once a day 3)  Study Drug - Niacin Vs Placebo  4)  Hydrochlorothiazide  12.5 Mg Tabs (Hydrochlorothiazide) .... Take 1 tablet by mouth once a day 5)  Aspirin Buffered 325 Mg Tabs (Aspirin buff(mgcarb-alaminoac)) .... Take 1 tablet by mouth once a day 6)  Nasonex 50 Mcg/act Susp (Mometasone furoate) .... Use 2 sprays ins each nostril once a day. 7)  Eq Loratadine 10 Mg Tabs (Loratadine) .... Take 1 tablet by mouth once a day 8)  Carbamazepine 200 Mg Tabs (Carbamazepine) .... Take 1 tablet by mouth two times a day  Patient Instructions: 1)  Please schedule a follow-up appointment in 1 month. 2)  You will start a new medicine today: Carbamazepine 200mg  by  mouth two times a day. 3)  Take some time off from work to see if it helps with your stress. Prescriptions: CARBAMAZEPINE 200 MG TABS (CARBAMAZEPINE) Take 1 tablet by mouth two times a day  #60 x 2   Entered and Authorized by:   Brooks Sailors MD   Signed by:   Brooks Sailors MD on 07/12/2009   Method used:   Print then Give to Patient   RxID:   [id number]   Prevention & Chronic Care Immunizations   Influenza vaccine: Not documented   Influenza vaccine deferral: Refused  (07/12/2009)    Tetanus booster: Not documented    Pneumococcal vaccine: Not documented  Colorectal Screening   Hemoccult: Not documented   Hemoccult action/deferral: Ordered  (06/06/2009)    Colonoscopy: Not documented  Other Screening   PSA: Not documented   Smoking status: quit  (06/06/2009)  Lipids   Total Cholesterol: Not documented   LDL: Not documented   LDL Direct: Not documented   HDL: Not documented   Triglycerides: Not documented    SGOT (AST): 13  (07/03/2008)   SGPT (ALT): 13  (07/03/2008)   Alkaline phosphatase: 71  (07/03/2008)   Total bilirubin: 0.4  (07/03/2008)    Lipid flowsheet reviewed?: Yes   Progress toward LDL goal: Unchanged  Hypertension   Last Blood Pressure: 121 / 73  (07/12/2009)   Serum creatinine: 0.79  (06/06/2009)   BMP action: Ordered   Serum potassium 4.0  (06/06/2009)     Hypertension flowsheet reviewed?: Yes   Progress toward BP goal: At goal  Self-Management Support :   Personal Goals (by the next clinic visit) :      Personal blood pressure goal: 140/90  (07/12/2009)     Personal LDL goal: 100  (07/12/2009)    Patient will work on the following items until the next clinic visit to reach self-care goals:     Medications and monitoring: take my medicines every day, check my blood pressure  (07/12/2009)     Eating: eat more vegetables, eat foods that are low in salt, eat baked foods instead of fried foods  (07/12/2009)     Other: working in yard  (06/06/2009)    Hypertension self-management support: Written self-care plan  (07/12/2009)   Hypertension self-care plan printed.    Lipid self-management support: Written self-care plan  (07/12/2009)   Lipid self-care plan printed.    Appended Document: f/u [mkj] Carbamazepine Rx called to [facility] per pt.'s request.

## 2010-07-22 ENCOUNTER — Other Ambulatory Visit: Payer: Self-pay | Admitting: Internal Medicine

## 2010-07-27 ENCOUNTER — Other Ambulatory Visit: Payer: Self-pay | Admitting: Internal Medicine

## 2010-07-31 ENCOUNTER — Other Ambulatory Visit: Payer: Self-pay | Admitting: *Deleted

## 2010-07-31 MED ORDER — LEVOTHYROXINE SODIUM 100 MCG PO TABS: 100.0000 ug | ORAL_TABLET | Freq: Every day | ORAL | Status: DC

## 2010-08-04 LAB — COMPREHENSIVE METABOLIC PANEL
ALT: 18 U/L (ref 0–53)
Albumin: 3.7 g/dL (ref 3.5–5.2)
Alkaline Phosphatase: 80 U/L (ref 39–117)
Chloride: 100 mEq/L (ref 96–112)
Glucose, Bld: 107 mg/dL — ABNORMAL HIGH (ref 70–99)
Potassium: 3.9 mEq/L (ref 3.5–5.1)
Sodium: 137 mEq/L (ref 135–145)
Total Protein: 6.4 g/dL (ref 6.0–8.3)

## 2010-08-04 LAB — DIFFERENTIAL
Basophils Relative: 0 % (ref 0–1)
Eosinophils Absolute: 0.5 10*3/uL (ref 0.0–0.7)
Monocytes Absolute: 0.8 10*3/uL (ref 0.1–1.0)
Monocytes Relative: 8 % (ref 3–12)
Neutrophils Relative %: 64 % (ref 43–77)

## 2010-08-04 LAB — CBC
HCT: 42.1 % (ref 39.0–52.0)
RBC: 4.72 MIL/uL (ref 4.22–5.81)
RDW: 12.2 % (ref 11.5–15.5)
WBC: 9.9 10*3/uL (ref 4.0–10.5)

## 2010-10-10 NOTE — Discharge Summary (Signed)
NAME:  Alec [name], Alec [name] NO.:  [id number]   MEDICAL RECORD NO.:  [id number]                   PATIENT TYPE:  INP   LOCATION:  4730                                 FACILITY:  [facility]   PHYSICIAN:  Manning Charity, MD                  DATE OF BIRTH:  13-Jul-1955   DATE OF ADMISSION:  12/18/2002  DATE OF DISCHARGE:  12/20/2002                                 DISCHARGE SUMMARY   DISCHARGE DIAGNOSES:  1. Acute thalamic infarction.  2. Hypertension.  3. Depression.  4. Hypothyroidism.  5. Chronic pain.  6. Carpal tunnel syndrome.   DISCHARGE MEDICATIONS:  1. Hydrochlorothiazide 25 mg a day.  2. Atenolol 25 mg a day.  3. Lipitor 20 mg a day.  4. Aspirin 325 mg.   FOLLOW UP:  The patient has a followup appointment in outpatient clinic with  Dr. Lyda Perone on January 03, 2003 at 2:00 for a blood pressure recheck and  followup on thyroid studies. The patient will need a repeat fasting lipid  profile and a repeat BMET in three to six months.   PROCEDURE:  CT of the brain without contrast done on December 18, 2002 was  within normal limits. MRI done on December 19, 2002 revealed an acute/subacute  right thalamic infarction. An MRI done on December 19, 2002 revealed mild  stenotic changes of the proximal cavernous segment of the internal carotid  arteries, mild stenosis of the right middle cerebral artery, and small  disease in the left vertebral arteries.   HISTORY OF PRESENT ILLNESS:  The patient is a 55 year old white male with a  past medical history significant for uncontrolled hypertension and  questionable history of hyperlipidemia. He presented to the emergency  department with a one day history of numbness on the left side of the face,  left hand, and left foot. The patient reports that this numbness came on  suddenly the morning of presentation. The patient denied any dizziness, any  syncope, any visual changes, headache, chest pain. The patient also with  left sided  weakness in the hand and the leg.   ADMISSION PHYSICAL EXAMINATION:  VITAL SIGNS:  Temperature 98.4, pulse 68,  blood pressure 190/135, respirations 18, oxygen saturation 96% on room air.  GENERAL:  The patient is a well-nourished, well-appearing male in no acute  distress.  HEENT:  Pupils were equal and reactive to light. Funduscopic exam was not  performed.  RESPIRATORY:  Good air movement, clear and equal.  CARDIOVASCULAR:  Regular rate and rhythm; no murmurs, rubs, or gallops; 2+  pulses.  GASTROINTESTINAL:  Positive bowel sounds, soft, nontender.  EXTREMITIES:  Normal other than a shorter fourth digit on the right hand.  SKIN:  Warm and dry without any abnormalities.  LYMPH:  Without any lymphadenopathy.  NEUROLOGICAL:  5/5 strength bilaterally in the upper and lower extremities.  Cranial nerves II-XII were intact. Normal cerebellar  function and sensation  was intact.  PSYCHIATRIC:  The patient was alert and oriented x3. Appropriate affect and  talkative.   ADMISSION LABORATORY DATA:  BMET with a sodium of 139, potassium 4.2,  chloride 104, CO2 27, BUN 12, creatinine 0.9, glucose 103, anion gap of 8,  calcium 8.7. CBC was with a white blood cell count of 7.8, hemoglobin of  15.4, hematocrit 45.4, platelets 231,000, MCV was 85.2. INR, PTT, and PT  were within normal limits. Urine drug screen was negative. Head CT without  contrast was within normal limits.   HOSPITAL COURSE:  1. Left sided weakness. MRI of the brain revealed an acute right thalamic     infarction. MRA revealed a few stenotic changes. TSH was performed to     evaluate for other causes of left sided weakness. This revealed     hypothyroidism with a TSH level of 8.631. This was repeated and found to     be 8.990. Free T4 levels were 0.85. Fasting lipid profile was with a     cholesterol of 258 and HDL of 47 and LDL of 191 with a ratio of 5.5. The     patient was started on Lipitor 20 mg a day and needs to have a  repeat     fasting lipid profile and a CMET done as an outpatient.  2. Hypertension. The patient was started on hydrochlorothiazide on     admission, received labetalol in the ER. The patient responded well to     this, with blood pressures dropping down to the 170s/100. The patient was     not started on any further blood pressure medicines as we did not want to     cause disruption of the autoregulation system in the brain. On the     following day on July 27, the patient was started on 25 mg of atenolol.     The patient was discharged on this medicine and needs to have a blood     pressure recheck on August 11.  3. EKG done on admission revealed T-wave inversions in leads I and aVL and     ST elevations in V1 through V4. There was no old EKG to compare this to.     The patient had no symptoms of chest pain, shortness of breath, or     diaphoresis. Cardiac enzymes were all negative with total CK of 124, 92,     and 89 and CK-MB of 0.9, 0.9, and 0.8. Will modify risk factors for     patient including hypertension and hyperlipidemia.  4. Hypothyroidism. The results from these labs were not back at the time of     discharge. The patient will be started on Synthroid at followup     appointment on August 11.                                                Manning Charity, MD    KK/MEDQ  D:  01/22/2003  T:  01/23/2003  Job:  [id number]

## 2010-10-28 ENCOUNTER — Encounter: Payer: Self-pay | Admitting: Internal Medicine

## 2010-10-28 ENCOUNTER — Ambulatory Visit (INDEPENDENT_AMBULATORY_CARE_PROVIDER_SITE_OTHER): Payer: Self-pay | Admitting: Internal Medicine

## 2010-10-28 DIAGNOSIS — F329 Major depressive disorder, single episode, unspecified: Secondary | ICD-10-CM

## 2010-10-28 DIAGNOSIS — I10 Essential (primary) hypertension: Secondary | ICD-10-CM

## 2010-10-28 DIAGNOSIS — E039 Hypothyroidism, unspecified: Secondary | ICD-10-CM

## 2010-10-28 DIAGNOSIS — Z0000 Encounter for general adult medical examination without abnormal findings: Secondary | ICD-10-CM

## 2010-10-28 DIAGNOSIS — E785 Hyperlipidemia, unspecified: Secondary | ICD-10-CM

## 2010-10-28 MED ORDER — MELOXICAM 7.5 MG PO TABS: 7.5000 mg | ORAL_TABLET | Freq: Every day | ORAL | Status: DC

## 2010-10-28 MED ORDER — HYDROCHLOROTHIAZIDE 12.5 MG PO CAPS: 12.5000 mg | ORAL_CAPSULE | ORAL | Status: DC

## 2010-10-28 MED ORDER — FLUOXETINE HCL 20 MG PO CAPS: 20.0000 mg | ORAL_CAPSULE | Freq: Every day | ORAL | Status: DC

## 2010-10-28 MED ORDER — LORATADINE 10 MG PO TABS: 10.0000 mg | ORAL_TABLET | Freq: Every day | ORAL | Status: DC

## 2010-10-28 MED ORDER — LEVOTHYROXINE SODIUM 100 MCG PO TABS: 100.0000 ug | ORAL_TABLET | Freq: Every day | ORAL | Status: DC

## 2010-10-28 NOTE — Assessment & Plan Note (Signed)
Not at goal Based on last lipid profile. Is not fasting today so called him to the clinic in a week for blood work. Continue Zocor at current dose until then. Of note, he stated he was not taking his Zocor for a few months before the last lipid profile.

## 2010-10-28 NOTE — Assessment & Plan Note (Signed)
Compliant with his Synthroid. Coming to the clinic in a week for labs recheck TSH at that time.

## 2010-10-28 NOTE — Assessment & Plan Note (Signed)
Not under control as per patient's history. Patient is not suicidal nor homicidal. He states the Prozac is not working. Can increase the dose of Prozac or change it to another antidepressant. He states that he is going to a psychiatrist ([facility]) next week. Will followup at next appointment. Will try to get records at that time.

## 2010-10-28 NOTE — Progress Notes (Signed)
55 year old man with history of hypertension, hyperlipidemia, stroke and depression comes for follow up. Last seen in the clinic in January 2011. No new complaints today. Compliant with his medications for blood pressure cholesterol and depression. Does not check blood pressure at home. No symptoms suggestive of hypotension. He is going to see a psychiatrist next week. He has never been seen by a psychiatrist before this would be his first appointment. He states the Prozac he is on is not helping.  BP 130/80  Pulse 65  Temp(Src) 97.9 F (36.6 C) (Oral)  Ht 5\' 8"  (1.727 m)  Wt 226 lb 11.2 oz (102.83 kg)  BMI 34.47 kg/m2  SpO2 98%  General Appearance:    Alert, cooperative, no distress, appears stated age  Head:    Normocephalic, without obvious abnormality, atraumatic  Eyes:    PERRL, conjunctiva/corneas clear, EOM's intact, fundi    benign, both eyes  Ears:    Normal TM's and external ear canals, both ears  Nose:   Nares normal, septum midline, mucosa normal, no drainage    or sinus tenderness  Throat:   Lips, mucosa, and tongue normal; teeth and gums normal  Neck:   Supple, symmetrical, trachea midline, no adenopathy;    thyroid:  no enlargement/tenderness/nodules; no carotid   bruit or JVD  Back:     Symmetric, no curvature, ROM normal, no CVA tenderness  Lungs:     Clear to auscultation bilaterally, respirations unlabored  Chest Wall:    No tenderness or deformity   Heart:    Regular rate and rhythm, S1 and S2 normal, no murmur, rub   or gallop  Abdomen:     Soft, non-tender, bowel sounds active all four quadrants,    no masses, no organomegaly  Extremities:   Extremities normal, atraumatic, no cyanosis or edema  Pulses:   2+ and symmetric all extremities  Skin:   Skin color, texture, turgor normal, no rashes or lesions  Lymph nodes:   Cervical, supraclavicular, and axillary nodes normal  Neurologic:   CNII-XII intact, normal strength, sensation and reflexes    throughout    Constitutional: Denies fever, chills, diaphoresis, appetite change and fatigue.  HEENT: Denies photophobia, eye pain, redness, hearing loss, ear pain, congestion, sore throat, rhinorrhea, sneezing, mouth sores, trouble swallowing, neck pain, neck stiffness and tinnitus.   Respiratory: Denies SOB, DOE, cough, chest tightness,  and wheezing.   Cardiovascular: Denies chest pain, palpitations and leg swelling.  Gastrointestinal: Denies nausea, vomiting, abdominal pain, diarrhea, constipation, blood in stool and abdominal distention.  Genitourinary: Denies dysuria, urgency, frequency, hematuria, flank pain and difficulty urinating.  Musculoskeletal: Denies myalgias, back pain, joint swelling, arthralgias and gait problem.  Skin: Denies pallor, rash and wound.  Neurological: Denies dizziness, seizures, syncope, weakness, light-headedness, numbness and headaches.  Hematological: Denies adenopathy. Easy bruising, personal or family bleeding history  Psychiatric/Behavioral: Denies suicidal ideation, mood changes, confusion, nervousness, sleep disturbance and agitation

## 2010-10-28 NOTE — Patient Instructions (Signed)
Follow up in 3 months Come in a week for blood work. I will call you if the results are abnormal and need to see you soon Let me know if you are not able to schedule an appointment with your psychiatrist Sent your prescription to [facility].

## 2010-10-28 NOTE — Assessment & Plan Note (Signed)
Well controlled 

## 2010-11-07 ENCOUNTER — Other Ambulatory Visit: Payer: Self-pay

## 2010-11-07 DIAGNOSIS — E039 Hypothyroidism, unspecified: Secondary | ICD-10-CM

## 2010-11-07 DIAGNOSIS — I10 Essential (primary) hypertension: Secondary | ICD-10-CM

## 2010-11-07 DIAGNOSIS — E785 Hyperlipidemia, unspecified: Secondary | ICD-10-CM

## 2010-11-07 LAB — COMPREHENSIVE METABOLIC PANEL
ALT: 23 U/L (ref 0–53)
CO2: 30 mEq/L (ref 19–32)
Calcium: 8.8 mg/dL (ref 8.4–10.5)
Chloride: 101 mEq/L (ref 96–112)
Sodium: 139 mEq/L (ref 135–145)
Total Protein: 6.3 g/dL (ref 6.0–8.3)

## 2010-11-07 LAB — LIPID PANEL: LDL Cholesterol: 114 mg/dL — ABNORMAL HIGH (ref 0–99)

## 2010-11-07 LAB — TSH: TSH: 1.54 u[IU]/mL (ref 0.350–4.500)

## 2010-11-30 ENCOUNTER — Encounter: Payer: Self-pay | Admitting: Internal Medicine

## 2011-04-19 ENCOUNTER — Other Ambulatory Visit: Payer: Self-pay | Admitting: Internal Medicine

## 2011-05-25 ENCOUNTER — Other Ambulatory Visit: Payer: Self-pay | Admitting: Internal Medicine

## 2011-06-29 ENCOUNTER — Other Ambulatory Visit: Payer: Self-pay | Admitting: Internal Medicine

## 2011-07-06 ENCOUNTER — Other Ambulatory Visit: Payer: Self-pay

## 2011-07-06 ENCOUNTER — Ambulatory Visit (HOSPITAL_COMMUNITY)
Admission: RE | Admit: 2011-07-06 | Discharge: 2011-07-06 | Disposition: A | Discharge status: Home / Self Care | Payer: Self-pay | Source: Ambulatory Visit | Attending: Internal Medicine | Admitting: Internal Medicine

## 2011-07-06 ENCOUNTER — Encounter: Payer: Self-pay | Admitting: Internal Medicine

## 2011-07-06 ENCOUNTER — Ambulatory Visit (INDEPENDENT_AMBULATORY_CARE_PROVIDER_SITE_OTHER): Payer: Self-pay | Admitting: Internal Medicine

## 2011-07-06 VITALS — BP 131/82 | HR 61 | Temp 96.6°F | Ht 61.4 in | Wt 218.4 lb

## 2011-07-06 DIAGNOSIS — R0602 Shortness of breath: Secondary | ICD-10-CM | POA: Insufficient documentation

## 2011-07-06 DIAGNOSIS — R0989 Other specified symptoms and signs involving the circulatory and respiratory systems: Secondary | ICD-10-CM

## 2011-07-06 DIAGNOSIS — I10 Essential (primary) hypertension: Secondary | ICD-10-CM | POA: Insufficient documentation

## 2011-07-06 DIAGNOSIS — E039 Hypothyroidism, unspecified: Secondary | ICD-10-CM

## 2011-07-06 DIAGNOSIS — R9431 Abnormal electrocardiogram [ECG] [EKG]: Secondary | ICD-10-CM | POA: Insufficient documentation

## 2011-07-06 DIAGNOSIS — E785 Hyperlipidemia, unspecified: Secondary | ICD-10-CM

## 2011-07-06 DIAGNOSIS — F329 Major depressive disorder, single episode, unspecified: Secondary | ICD-10-CM

## 2011-07-06 LAB — CBC WITH DIFFERENTIAL/PLATELET
Basophils Absolute: 0 10*3/uL (ref 0.0–0.1)
Basophils Relative: 0 % (ref 0–1)
MCHC: 32.6 g/dL (ref 30.0–36.0)
Monocytes Absolute: 0.7 10*3/uL (ref 0.1–1.0)
Neutro Abs: 6.1 10*3/uL (ref 1.7–7.7)
Neutrophils Relative %: 72 % (ref 43–77)
RDW: 13 % (ref 11.5–15.5)

## 2011-07-06 LAB — COMPREHENSIVE METABOLIC PANEL
AST: 16 U/L (ref 0–37)
Albumin: 3.7 g/dL (ref 3.5–5.2)
Alkaline Phosphatase: 86 U/L (ref 39–117)
BUN: 17 mg/dL (ref 6–23)
Potassium: 4.1 mEq/L (ref 3.5–5.3)

## 2011-07-06 NOTE — Assessment & Plan Note (Signed)
controlled 

## 2011-07-06 NOTE — Patient Instructions (Signed)
Follow up in 4-6 weeks

## 2011-07-06 NOTE — Assessment & Plan Note (Signed)
At goal.  

## 2011-07-06 NOTE — Assessment & Plan Note (Signed)
Going on for more than a year Not assoicated with chest pain, orthopnea, PND, cough or other complaints No medical attention sought yet Does not remember how it started  Pedal edea (1-2+ b/l) on physical, otherwise normal  Plan - check cbc, cmet, tsh, ekg, echo, cxr, bnp - may need PT referral for deconditioning and or PFTs in no answers from above tests.

## 2011-07-06 NOTE — Assessment & Plan Note (Signed)
Check TSH 

## 2011-07-06 NOTE — Assessment & Plan Note (Signed)
Sees Dr. Carolanne Grumbling, he was started on Topamax in addition to prozac. Seems to be helping

## 2011-07-06 NOTE — Progress Notes (Signed)
Patient ID: Alec [name], male   DOB: 1955-07-29, 56 y.o.   MRN: [id number]  56 year old man with history of hypertension, hyperlipidemia, stroke and depression comes for follow up. Last seen in the clinic in 6/12.   complaints of exertional dyspena today. See A&P for details  Compliant with his medications for blood pressure cholesterol and depression.  He is also seeing apsychiatrist for depression  Physical exam   General Appearance:     Filed Vitals:   07/06/11 0935  BP: 131/82  Pulse: 61  Temp: 96.6 F (35.9 C)  TempSrc: Oral  Height: 5' 1.4" (1.56 m)  Weight: 218 lb 6.4 oz (99.066 kg)  SpO2: 100%     Alert, cooperative, no distress, appears stated age  Head:    Normocephalic, without obvious abnormality, atraumatic  Eyes:    PERRL, conjunctiva/corneas clear, EOM's intact, fundi    benign, both eyes       Neck:   Supple, symmetrical, trachea midline, no adenopathy;       thyroid:  No enlargement/tenderness/nodules; no carotid   bruit or JVD  Lungs:     Clear to auscultation bilaterally, respirations unlabored. No wheezes or chackles  Chest wall:    No tenderness or deformity  Heart:    Regular rate and rhythm, S1 and S2 normal, no murmur, rub   or gallop  Abdomen:     Soft, non-tender, bowel sounds active all four quadrants,    no masses, no organomegaly  Extremities:   1-2+ edema b/l, Extremities normal, atraumatic, no cyanosis   Pulses:   2+ and symmetric all extremities  Skin:   Skin color, texture, turgor normal, no rashes or lesions  Neurologic:  nonfocal grossly   ROS  Constitutional: Denies fever, chills, diaphoresis, appetite change and fatigue.  Respiratory: Has SOB on exertion, denies cough, chest tightness,  and wheezing.   Cardiovascular: Denies chest pain, palpitations and leg swelling.  Gastrointestinal: Denies nausea, vomiting, abdominal pain, diarrhea, constipation, blood in stool and abdominal distention.  Skin: Denies pallor, rash and wound.    Neurological: Denies dizziness, light-headedness, numbness and headaches.

## 2011-07-22 ENCOUNTER — Ambulatory Visit (HOSPITAL_COMMUNITY)
Admission: RE | Admit: 2011-07-22 | Discharge: 2011-07-22 | Disposition: A | Discharge status: Home / Self Care | Payer: Self-pay | Source: Ambulatory Visit | Attending: Internal Medicine | Admitting: Internal Medicine

## 2011-07-22 DIAGNOSIS — R0609 Other forms of dyspnea: Secondary | ICD-10-CM

## 2011-07-22 DIAGNOSIS — R0989 Other specified symptoms and signs involving the circulatory and respiratory systems: Secondary | ICD-10-CM

## 2011-07-22 NOTE — Progress Notes (Signed)
  Echocardiogram 2D Echocardiogram has been performed.  Jorje Guild [facility] 07/22/2011, 10:45 AM

## 2011-07-30 ENCOUNTER — Other Ambulatory Visit: Payer: Self-pay | Admitting: Internal Medicine

## 2011-08-24 ENCOUNTER — Encounter: Payer: Self-pay | Admitting: Internal Medicine

## 2011-08-24 ENCOUNTER — Ambulatory Visit (INDEPENDENT_AMBULATORY_CARE_PROVIDER_SITE_OTHER): Payer: Self-pay | Admitting: Internal Medicine

## 2011-08-24 VITALS — BP 123/80 | HR 99 | Temp 96.6°F | Ht 68.0 in | Wt 215.5 lb

## 2011-08-24 DIAGNOSIS — K219 Gastro-esophageal reflux disease without esophagitis: Secondary | ICD-10-CM

## 2011-08-24 DIAGNOSIS — R0989 Other specified symptoms and signs involving the circulatory and respiratory systems: Secondary | ICD-10-CM

## 2011-08-24 DIAGNOSIS — I10 Essential (primary) hypertension: Secondary | ICD-10-CM

## 2011-08-24 DIAGNOSIS — E785 Hyperlipidemia, unspecified: Secondary | ICD-10-CM

## 2011-08-24 LAB — LIPID PANEL
Cholesterol: 254 mg/dL — ABNORMAL HIGH (ref 0–200)
HDL: 44 mg/dL (ref >39)
Total CHOL/HDL Ratio: 5.8 Ratio
Triglycerides: 113 mg/dL (ref <150)
VLDL: 23 mg/dL (ref 0–40)

## 2011-08-24 MED ORDER — HYDROCHLOROTHIAZIDE 12.5 MG PO CAPS: 12.5000 mg | ORAL_CAPSULE | ORAL | Status: DC

## 2011-08-24 MED ORDER — FAMOTIDINE 20 MG PO TABS: 20.0000 mg | ORAL_TABLET | Freq: Two times a day (BID) | ORAL | Status: DC

## 2011-08-24 MED ORDER — PRAVASTATIN SODIUM 40 MG PO TABS: 40.0000 mg | ORAL_TABLET | Freq: Every evening | ORAL | Status: DC

## 2011-08-24 MED ORDER — LEVOTHYROXINE SODIUM 100 MCG PO TABS: 100.0000 ug | ORAL_TABLET | Freq: Every day | ORAL | Status: DC

## 2011-08-24 NOTE — Assessment & Plan Note (Signed)
Controlled.  

## 2011-08-24 NOTE — Progress Notes (Signed)
Patient ID: Alec [name], male   DOB: 1955-06-30, 56 y.o.   MRN: [id number]  56 year old man with past medical history of hypertension, hyperlipidemia, hypothyroidism and depression comes for followup.\ No new complaints today. Workup for exertional dyspnea was negative in February 2013.  Compliant with his medication except his statin which he stopped a month ago because it was very expensive and could not afford it Complains of acid reflux which he had for many years before and was taking some medicines but has stopped taking it for past few years. Requests refill for those medicines  Physical exam   General Appearance:     Filed Vitals:   08/24/11 1109  BP: 123/80  Pulse: 99  Temp: 96.6 F (35.9 C)  TempSrc: Oral  Height: 5\' 8"  (1.727 m)  Weight: 215 lb 8 oz (97.75 kg)     Alert, cooperative, no distress, appears stated age  Head:    Normocephalic, without obvious abnormality, atraumatic  Eyes:    PERRL, conjunctiva/corneas clear, EOM's intact, fundi    benign, both eyes       Neck:   Supple, symmetrical, trachea midline, no adenopathy;       thyroid:  No enlargement/tenderness/nodules; no carotid   bruit or JVD  Lungs:     Clear to auscultation bilaterally, respirations unlabored  Chest wall:    No tenderness or deformity  Heart:    Regular rate and rhythm, S1 and S2 normal, no murmur, rub   or gallop  Abdomen:     Soft, non-tender, bowel sounds active all four quadrants,    no masses, no organomegaly  Extremities:   Extremities normal, atraumatic, no cyanosis or edema  Pulses:   2+ and symmetric all extremities  Skin:   Skin color, texture, turgor normal, no rashes or lesions  Neurologic:  nonfocal grossly   Review of systems  Constitutional: Denies fever, chills, diaphoresis, appetite change and fatigue.  Respiratory: Denies SOB, DOE, cough, chest tightness,  and wheezing.   Cardiovascular: Denies chest pain, palpitations and leg swelling.  Gastrointestinal: Denies  nausea, vomiting, abdominal pain, diarrhea, constipation, blood in stool and abdominal distention.  Skin: Denies pallor, rash and wound.  Neurological: Denies dizziness, light-headedness, numbness and headaches.

## 2011-08-24 NOTE — Assessment & Plan Note (Signed)
He has not been taking his statin for past one month because he could not afford Zocor Change to Pravachol which is for daughter at [facility]. Check lipid profile today. Followup in 8 weeks. Repeat lipid profile and LFT at that time.

## 2011-08-24 NOTE — Patient Instructions (Signed)
Follow up with Jaynee Eagles for orange card

## 2011-08-24 NOTE — Assessment & Plan Note (Signed)
No significant worsening of his symptoms. Workup done last visit was negative. Patient says that he is having some throat irritation and epigastric burning when he lies down and after meals. He thinks that this is what was causing his shortness of breath. He had this complaint for many years and was on medicine for acid reflux before but he stopped taking it for past few years. I discussed with him that it is unlikely that acid reflux would cause exertional dyspnea and if his symptoms persist or get worse he may need some additional workup as outlined in previous assessment and plan what we'll try acid reflux treatment before

## 2011-08-24 NOTE — Assessment & Plan Note (Signed)
Start Pepcid which is $4 at [facility] Could not give Zantac because of his procaine allergy Follow up in 2 months

## 2011-09-23 ENCOUNTER — Other Ambulatory Visit: Payer: Self-pay | Admitting: *Deleted

## 2011-09-25 MED ORDER — FAMOTIDINE 20 MG PO TABS: 20.0000 mg | ORAL_TABLET | Freq: Two times a day (BID) | ORAL | Status: DC

## 2011-10-27 ENCOUNTER — Ambulatory Visit (INDEPENDENT_AMBULATORY_CARE_PROVIDER_SITE_OTHER): Payer: Self-pay | Admitting: Internal Medicine

## 2011-10-27 ENCOUNTER — Encounter: Payer: Self-pay | Admitting: Internal Medicine

## 2011-10-27 VITALS — BP 119/76 | HR 91 | Temp 96.6°F | Ht 68.0 in | Wt 216.9 lb

## 2011-10-27 DIAGNOSIS — E785 Hyperlipidemia, unspecified: Secondary | ICD-10-CM

## 2011-10-27 DIAGNOSIS — K219 Gastro-esophageal reflux disease without esophagitis: Secondary | ICD-10-CM

## 2011-10-27 DIAGNOSIS — Z23 Encounter for immunization: Secondary | ICD-10-CM

## 2011-10-27 NOTE — Assessment & Plan Note (Signed)
Compliant with pravachol Check lipids and cmet

## 2011-10-27 NOTE — Assessment & Plan Note (Signed)
Improved with pepsid

## 2011-10-27 NOTE — Progress Notes (Signed)
Patient ID: Alec [name], male   DOB: [birth year], 56 y.o.   MRN: [id number] Patient ID: Alec [name], male   DOB: 1955/07/09, 56 y.o.   MRN: [id number]  56 year old man with past medical history of hypertension, hyperlipidemia, hypothyroidism and depression comes for followup.\ No new complaints  Compliant with his medication   Physical exam   General Appearance:     Filed Vitals:   10/27/11 1315  BP: 119/76  Pulse: 91  Temp: 96.6 F (35.9 C)  TempSrc: Oral  Height: 5\' 8"  (1.727 m)  Weight: 216 lb 14.4 oz (98.385 kg)  SpO2: 98%     Alert, cooperative, no distress, appears stated age  Head:    Normocephalic, without obvious abnormality, atraumatic  Eyes:    PERRL, conjunctiva/corneas clear, EOM's intact, fundi    benign, both eyes       Neck:   Supple, symmetrical, trachea midline, no adenopathy;       thyroid:  No enlargement/tenderness/nodules; no carotid   bruit or JVD  Lungs:     Clear to auscultation bilaterally, respirations unlabored  Chest wall:    No tenderness or deformity  Heart:    Regular rate and rhythm, S1 and S2 normal, no murmur, rub   or gallop  Abdomen:     Soft, non-tender, bowel sounds active all four quadrants,    no masses, no organomegaly  Extremities:   Extremities normal, atraumatic, no cyanosis, 1+ edema (chronic)  Pulses:   2+ and symmetric all extremities  Skin:   Skin color, texture, turgor normal, no rashes or lesions  Neurologic:  nonfocal grossly   Review of systems  Constitutional: Denies fever, chills, diaphoresis, appetite change and fatigue.  Respiratory: Denies SOB, DOE, cough, chest tightness,  and wheezing.   Cardiovascular: Denies chest pain, palpitations and leg swelling.  Gastrointestinal: Denies nausea, vomiting, abdominal pain, diarrhea, constipation, blood in stool and abdominal distention.  Skin: Denies pallor, rash and wound.  Neurological: Denies dizziness, light-headedness, numbness and headaches.

## 2011-10-28 LAB — COMPREHENSIVE METABOLIC PANEL
ALT: 20 U/L (ref 0–53)
Albumin: 4.3 g/dL (ref 3.5–5.2)
CO2: 29 mEq/L (ref 19–32)
Calcium: 9.6 mg/dL (ref 8.4–10.5)
Chloride: 104 mEq/L (ref 96–112)
Potassium: 4.8 mEq/L (ref 3.5–5.3)
Sodium: 142 mEq/L (ref 135–145)
Total Protein: 6.7 g/dL (ref 6.0–8.3)

## 2011-10-28 LAB — LIPID PANEL
LDL Cholesterol: 107 mg/dL — ABNORMAL HIGH (ref 0–99)
Triglycerides: 375 mg/dL — ABNORMAL HIGH (ref <150)

## 2011-10-29 ENCOUNTER — Other Ambulatory Visit: Payer: Self-pay | Admitting: *Deleted

## 2011-10-29 MED ORDER — PRAVASTATIN SODIUM 40 MG PO TABS: 40.0000 mg | ORAL_TABLET | Freq: Every evening | ORAL | Status: DC

## 2011-10-30 NOTE — Telephone Encounter (Signed)
Rx called in to pharmacy. 

## 2011-11-25 ENCOUNTER — Other Ambulatory Visit: Payer: Self-pay | Admitting: *Deleted

## 2011-11-25 MED ORDER — MELOXICAM 7.5 MG PO TABS: 7.5000 mg | ORAL_TABLET | Freq: Every day | ORAL | Status: DC

## 2011-11-25 NOTE — Telephone Encounter (Signed)
Original script at [facility] but it has expired, please refill for [facility]

## 2011-11-25 NOTE — Telephone Encounter (Signed)
Creatinine trending up slightly so will not refill for one year.

## 2012-02-03 ENCOUNTER — Encounter: Payer: Self-pay | Admitting: Internal Medicine

## 2012-02-03 ENCOUNTER — Ambulatory Visit: Payer: Self-pay | Admitting: Internal Medicine

## 2012-02-03 ENCOUNTER — Ambulatory Visit (INDEPENDENT_AMBULATORY_CARE_PROVIDER_SITE_OTHER): Payer: Self-pay | Admitting: Internal Medicine

## 2012-02-03 VITALS — BP 120/78 | HR 74 | Temp 97.8°F | Ht 68.0 in | Wt 217.4 lb

## 2012-02-03 DIAGNOSIS — K219 Gastro-esophageal reflux disease without esophagitis: Secondary | ICD-10-CM

## 2012-02-03 DIAGNOSIS — E039 Hypothyroidism, unspecified: Secondary | ICD-10-CM

## 2012-02-03 DIAGNOSIS — I10 Essential (primary) hypertension: Secondary | ICD-10-CM

## 2012-02-03 LAB — BASIC METABOLIC PANEL WITH GFR
BUN: 16 mg/dL (ref 6–23)
CO2: 25 mEq/L (ref 19–32)
Calcium: 9 mg/dL (ref 8.4–10.5)
Chloride: 107 mEq/L (ref 96–112)
Creat: 0.93 mg/dL (ref 0.50–1.35)
Glucose, Bld: 77 mg/dL (ref 70–99)
Sodium: 141 mEq/L (ref 135–145)

## 2012-02-03 MED ORDER — PRAVASTATIN SODIUM 40 MG PO TABS: 40.0000 mg | ORAL_TABLET | Freq: Every evening | ORAL | Status: DC

## 2012-02-03 MED ORDER — MELOXICAM 7.5 MG PO TABS: 7.5000 mg | ORAL_TABLET | Freq: Every day | ORAL | Status: DC

## 2012-02-03 MED ORDER — LEVOTHYROXINE SODIUM 100 MCG PO TABS: 100.0000 ug | ORAL_TABLET | Freq: Every day | ORAL | Status: DC

## 2012-02-03 MED ORDER — HYDROCHLOROTHIAZIDE 12.5 MG PO CAPS: 12.5000 mg | ORAL_CAPSULE | ORAL | Status: DC

## 2012-02-03 MED ORDER — FAMOTIDINE 20 MG PO TABS: 20.0000 mg | ORAL_TABLET | Freq: Two times a day (BID) | ORAL | Status: DC

## 2012-02-03 NOTE — Assessment & Plan Note (Signed)
Are stable. His last TSH was checked in February of 2013 with a level of 0.938 within normal limit. I will not repeat his TSH today and would check a in 3-6 months when he comes back to see his primary care physician. I will refill his levothyroxin 100 mcg every morning.

## 2012-02-03 NOTE — Patient Instructions (Addendum)
Continue your current medications Will get lab today and I will call you with any abnormal lab results Follow up with your primary care doctor in 3-4 months

## 2012-02-03 NOTE — Assessment & Plan Note (Signed)
Stable. Denies any symptoms at this time. Will continue Pepcid 20 mg by mouth twice a day. Refills were sent to [facility]

## 2012-02-03 NOTE — Assessment & Plan Note (Signed)
Well-controlled. Will continue hydrochlorothiazide 12.5 mg by mouth q daily -Will check a BMP today to make sure that his electrolytes are within normal limit (especially he is also on meloxicam)

## 2012-02-03 NOTE — Progress Notes (Signed)
Mr. Go is a 56 yo M with PMH of hypothyroidism, hypertension, GERD, hyperlipidemia, depression presents today for medication refill. He states that he has some problem with medication refill at [facility] and was told to be seen. He has no other complaints.  He takes meloxicam for his finger pain which occasionally locks up on him as well as back pain.  He states that he has not been taking meloxicam in the past several months because he ran out. He also ran of his hydrochlorothiazide a couple of days ago. Of note he also reported that he had a stroke about 10 years ago and also his son passed away one year ago with a massive heart attack at the age of 67.  Review of system: As per history of present illness  Physical examination: General: alert, well-developed, and cooperative to examination.  Lungs: normal respiratory effort, no accessory muscle use, normal breath sounds, no crackles, and no wheezes. Heart: normal rate, regular rhythm, no murmur, no gallop, and no rub.  Abdomen: soft, non-tender, normal bowel sounds, no distention, no guarding, no rebound tenderness, no hepatomegaly, and no splenomegaly.  Extremities: No cyanosis, clubbing, +2 pitting edema on LE bilaterally Neurologic: alert & oriented X3, cranial nerves II-XII intact, strength normal in all extremities, sensation intact to light touch, and gait normal.  Psych: Appropriate

## 2013-02-10 ENCOUNTER — Other Ambulatory Visit: Payer: Self-pay | Admitting: *Deleted

## 2013-02-10 DIAGNOSIS — I10 Essential (primary) hypertension: Secondary | ICD-10-CM

## 2013-02-10 MED ORDER — HYDROCHLOROTHIAZIDE 12.5 MG PO CAPS
12.5000 mg | ORAL_CAPSULE | ORAL | Status: DC
Start: 2013-02-10 — End: 2014-05-15

## 2013-02-10 MED ORDER — LEVOTHYROXINE SODIUM 100 MCG PO TABS: 100.0000 ug | ORAL_TABLET | Freq: Every day | ORAL | Status: DC

## 2013-02-10 NOTE — Telephone Encounter (Signed)
Last visit 9/13 Last TSH 2/13

## 2013-03-30 ENCOUNTER — Other Ambulatory Visit: Payer: Self-pay | Admitting: *Deleted

## 2013-03-30 MED ORDER — PRAVASTATIN SODIUM 40 MG PO TABS: 40.0000 mg | ORAL_TABLET | Freq: Every evening | ORAL | Status: DC

## 2014-02-09 ENCOUNTER — Other Ambulatory Visit: Payer: Self-pay | Admitting: Internal Medicine

## 2014-02-13 NOTE — Telephone Encounter (Signed)
Talked with pt and he will come in 10/2 for routine visit

## 2014-02-23 ENCOUNTER — Encounter: Payer: Self-pay | Admitting: Internal Medicine

## 2014-02-23 ENCOUNTER — Ambulatory Visit (INDEPENDENT_AMBULATORY_CARE_PROVIDER_SITE_OTHER): Payer: Self-pay | Admitting: Internal Medicine

## 2014-02-23 VITALS — BP 128/67 | HR 69 | Temp 98.3°F | Wt 215.7 lb

## 2014-02-23 DIAGNOSIS — E785 Hyperlipidemia, unspecified: Secondary | ICD-10-CM

## 2014-02-23 DIAGNOSIS — Z8673 Personal history of transient ischemic attack (TIA), and cerebral infarction without residual deficits: Secondary | ICD-10-CM

## 2014-02-23 DIAGNOSIS — F329 Major depressive disorder, single episode, unspecified: Secondary | ICD-10-CM

## 2014-02-23 DIAGNOSIS — K219 Gastro-esophageal reflux disease without esophagitis: Secondary | ICD-10-CM

## 2014-02-23 DIAGNOSIS — F32A Depression, unspecified: Secondary | ICD-10-CM

## 2014-02-23 DIAGNOSIS — I10 Essential (primary) hypertension: Secondary | ICD-10-CM

## 2014-02-23 DIAGNOSIS — E039 Hypothyroidism, unspecified: Secondary | ICD-10-CM

## 2014-02-23 DIAGNOSIS — J302 Other seasonal allergic rhinitis: Secondary | ICD-10-CM

## 2014-02-23 DIAGNOSIS — Z0000 Encounter for general adult medical examination without abnormal findings: Secondary | ICD-10-CM

## 2014-02-23 NOTE — Patient Instructions (Addendum)
-  Will check your blood work today and call you with abnormal results -Keep taking your current medications -Once you have insurance please come back and will check your cholesterol and refer you for colonoscopy -Pleasure meeting you today!   General Instructions:   Thank you for bringing your medicines today. This helps [place] keep you safe from mistakes.   Progress Toward Treatment Goals:  No flowsheet data found.  Self Care Goals & Plans:  Self Care Goal 02/23/2014  Manage my medications take my medicines as prescribed; refill my medications on time  Eat healthy foods eat foods that are low in salt; eat baked foods instead of fried foods    No flowsheet data found.   Care Management & Community Referrals:  No flowsheet data found.

## 2014-02-23 NOTE — Progress Notes (Signed)
Patient ID: Alec [name], male   DOB: [birth year], 58 y.o.   MRN: [id number]    Subjective:   Patient ID: Alec [name] male   DOB: May 24, 1956 58 y.o.   MRN: [id number]  HPI: [name] is a 58 y.o. pleasant man with past medical history of hypertension, hyperlipidemia, hypothyroidism on replacement therapy, thalamic CVA in 2004, depression, chronic low back pain, and seasonal allergic rhinitis who presents for routine clinic visit.   He reports compliance with taking HCTZ for hypertension and reports occasional bilateral LE swelling and dyspnea with exertion but denies headache, blurry vision, chest pain, or lightheadedness.   He was history of hypothyroidism with last TSH that was normal on 07/06/11. He reports compliance with taking levothyroxine 100 mcg daily. He denies choking, neck enlargement/tenderness, dysphagia, or hoarseness of his voice. He denies symptoms of thyroid dysfunction.    He reports compliance with taking pravastatin for hyperlipidemia and denies myalgias.   He reports his mood is stable but at times is depressed because he is currently unemployed and does not have activities to occupy his time. He is compliant with taking topiramate and fluoxetine which are managed by his psychiatrist.   He has history of seasonal allergic rhinitis and reports no recent symptoms. He has taken claritin in the past with mild relief and has never used intranasal corticosteroids.  He no longer has acid reflux symptoms and no longer takes pepcid.   Due to lack of insurance he would like to defer screening colonoscopy and flu shot vaccination at this time.     Past Medical History  Diagnosis Date  . Hypertension   . Depression     sees psychiatrist at behavioral health  . Hypothyroidism   . Hyperlipidemia   . Stroke 2004    acute thalamic infarct, started on aspirin at that time   Current Outpatient Prescriptions  Medication Sig Dispense Refill  . aspirin 81 MG tablet  Take 81 mg by mouth daily.        . famotidine (PEPCID) 20 MG tablet Take 1 tablet (20 mg total) by mouth 2 (two) times daily.  180 tablet  5  . FLUoxetine (PROZAC) 20 MG capsule Take 20 mg by mouth daily. Prescribed by psych (Dr. Donnelly Angelica)      . hydrochlorothiazide (MICROZIDE) 12.5 MG capsule Take 1 capsule (12.5 mg total) by mouth every morning.  90 capsule  11  . levothyroxine (SYNTHROID, LEVOTHROID) 100 MCG tablet TAKE ONE TABLET DAILY  90 tablet  0  . loratadine (CLARITIN) 10 MG tablet Take 1 tablet (10 mg total) by mouth daily.  31 tablet  12  . meloxicam (MOBIC) 7.5 MG tablet Take 1 tablet (7.5 mg total) by mouth daily.  90 tablet  3  . pravastatin (PRAVACHOL) 40 MG tablet Take 1 tablet (40 mg total) by mouth every evening.  90 tablet  4  . topiramate (TOPAMAX) 25 MG capsule Take 25 mg by mouth daily. Prescribed by psychiatrist (Dr. Donnelly Angelica)       No current facility-administered medications for this visit.   Family History  Problem Relation Age of Onset  . Stroke Father    History   Social History  . Marital Status: Single    Spouse Name: N/A    Number of Children: N/A  . Years of Education: N/A   Social History Main Topics  . Smoking status: Former Smoker    Types: Cigarettes    Quit date: 05/25/1994  .  Smokeless tobacco: None  . Alcohol Use: No  . Drug Use: No  . Sexual Activity: None   Other Topics Concern  . None   Social History Narrative  . None   Review of Systems: Review of Systems  Constitutional: Negative for fever, chills and malaise/fatigue.  Eyes: Negative for blurred vision.  Respiratory: Positive for shortness of breath (with exertion ). Negative for cough and wheezing.   Cardiovascular: Positive for leg swelling. Negative for chest pain and palpitations.  Gastrointestinal: Negative for heartburn, nausea, vomiting, abdominal pain, diarrhea, constipation, blood in stool and melena.  Genitourinary: Negative for dysuria, urgency, frequency  and hematuria.  Musculoskeletal: Positive for back pain (chronic low back ), joint pain (b/l hands, knees, and hips) and neck pain. Negative for myalgias.  Neurological: Negative for dizziness, sensory change and headaches.  Endo/Heme/Allergies: Positive for environmental allergies.  Psychiatric/Behavioral: Positive for depression. The patient has insomnia (due to pain).      Objective:  Physical Exam: Filed Vitals:   02/23/14 1529  BP: 128/67  Pulse: 69  Temp: 98.3 F (36.8 C)  TempSrc: Oral  Weight: 215 lb 11.2 oz (97.841 kg)  SpO2: 98%   Physical Exam  Constitutional: He is oriented to person, place, and time. He appears well-developed and well-nourished. No distress.  HENT:  Head: Normocephalic and atraumatic.  Right Ear: External ear normal.  Left Ear: External ear normal.  Nose: Nose normal.  Mouth/Throat: Oropharynx is clear and moist. No oropharyngeal exudate.  Poor dentition   Eyes: Conjunctivae and EOM are normal. Pupils are equal, round, and reactive to light. Right eye exhibits no discharge. Left eye exhibits no discharge. No scleral icterus.  Neck: Normal range of motion. Neck supple. No thyromegaly present.  Cardiovascular: Normal rate and regular rhythm.   Murmur (soft ) heard. Pulmonary/Chest: Effort normal and breath sounds normal. No respiratory distress. He has no wheezes. He has no rales.  Abdominal: Soft. Bowel sounds are normal. He exhibits no distension. There is no tenderness. There is no rebound and no guarding.  Musculoskeletal: Normal range of motion. He exhibits edema (trace b/l LE). He exhibits no tenderness.  Neurological: He is alert and oriented to person, place, and time.  Skin: Skin is warm and dry. No rash noted. He is not diaphoretic. No erythema. No pallor.  Psychiatric: He has a normal mood and affect. His behavior is normal. Judgment and thought content normal.    Assessment & Plan:   Please see problem list for problem-based  assessment and plan

## 2014-02-24 LAB — COMPLETE METABOLIC PANEL WITH GFR
ALT: 15 U/L (ref 0–53)
AST: 12 U/L (ref 0–37)
Albumin: 3.8 g/dL (ref 3.5–5.2)
Alkaline Phosphatase: 83 U/L (ref 39–117)
BUN: 17 mg/dL (ref 6–23)
CO2: 26 meq/L (ref 19–32)
Calcium: 9 mg/dL (ref 8.4–10.5)
Chloride: 103 mEq/L (ref 96–112)
Creat: 0.98 mg/dL (ref 0.50–1.35)
GFR, Est African American: >89 mL/min
GFR, Est Non African American: 84 mL/min
Glucose, Bld: 101 mg/dL — ABNORMAL HIGH (ref 70–99)
Potassium: 3.7 mEq/L (ref 3.5–5.3)
SODIUM: 138 meq/L (ref 135–145)
TOTAL PROTEIN: 6.1 g/dL (ref 6.0–8.3)
Total Bilirubin: 0.4 mg/dL (ref 0.2–1.2)

## 2014-02-24 LAB — TSH: TSH: 0.78 u[IU]/mL (ref 0.350–4.500)

## 2014-02-24 LAB — T4, FREE: Free T4: 1.16 ng/dL (ref 0.80–1.80)

## 2014-02-25 DIAGNOSIS — J302 Other seasonal allergic rhinitis: Secondary | ICD-10-CM | POA: Insufficient documentation

## 2014-02-25 DIAGNOSIS — Z8673 Personal history of transient ischemic attack (TIA), and cerebral infarction without residual deficits: Secondary | ICD-10-CM | POA: Insufficient documentation

## 2014-02-25 DIAGNOSIS — Z0000 Encounter for general adult medical examination without abnormal findings: Secondary | ICD-10-CM | POA: Insufficient documentation

## 2014-02-25 MED ORDER — CETIRIZINE HCL 10 MG PO TABS: 10.0000 mg | ORAL_TABLET | Freq: Every day | ORAL | Status: DC | PRN

## 2014-02-25 NOTE — Assessment & Plan Note (Addendum)
Assessment: Pt with history of GERD no longer on medical therapy who presents with no symptoms of acid reflux disease.  Plan:  -Continue to monitor -If symptoms return will prescribe esomeprazole

## 2014-02-25 NOTE — Assessment & Plan Note (Addendum)
Assessment: Pt with seasonal allergic rhinitis not on medical therapy who presents with no recent symptoms.    Plan:  -Prescribe cetirizine 10 mg PRN symptoms  -Pt declined fluticasone INC therapy at this time

## 2014-02-25 NOTE — Assessment & Plan Note (Addendum)
Assessment: Pt with history of right thalamic ishcemic CVA in 2004 with no residual deficits compliant with AP and statin therapy who presents with no neurological symptoms.  Plan:  -Continue aspirin 81 mg daily and pravastatin 40 mg daily

## 2014-02-25 NOTE — Assessment & Plan Note (Signed)
Assessment: Pt with last lipid panel on 10/27/11 with hypercholesteremia and hypertriglyceridemia compliant with moderate intensity statin therapy with 10-yr ASCVD risk of 11.8% and lifetime risk of 50% with recommendations to continue moderate to high intensity statin therapy.   Plan:  -Due to insurance issue will defer obtaining lipid panel until next visit -Continue pravastatin 40 mg daily  -Obtain CMP ---> normal liver function -Continue to monitor for myalgias

## 2014-02-25 NOTE — Assessment & Plan Note (Addendum)
Assessment: Pt with unspecified depression compliant with SSRI and AED combinational therapy who presents with stable mood.   Plan:  -Continue fluoxetine 20 mg daily and topiramate 50 mg daily  -Pt follows with psychiatry for refill of these medications

## 2014-02-25 NOTE — Assessment & Plan Note (Addendum)
Assessment: Pt with hypothyroidism of unknown etiology (possibly Hashimoto's) compliant with thyroid replacement therapy who presents with no symptoms of thyroid dysfunction.   Plan:  -Obtain TSH and free T4 ---> normal  -Continue levothyroxine 100 mcg daily -Continue to monitor for thyroid dysfunction

## 2014-02-25 NOTE — Assessment & Plan Note (Signed)
Assessment: Pt with well-controlled hypertension compliant with 1-class (diruetic) anti-hypertensive therapy who presents with blood pressure of 128/67.    Plan: -BP 128/67 at goal <140/90 -Continue HCTZ 12.5 mg daily  -Obtain CMP ---> normal

## 2014-02-25 NOTE — Assessment & Plan Note (Signed)
Pt declines screening colonoscopy and annual influenza vaccination today on 02/23/14.

## 2014-02-26 NOTE — Progress Notes (Signed)
INTERNAL MEDICINE TEACHING ATTENDING ADDENDUM - Donell Sliwinski, MD: I reviewed and discussed at the time of visit with the resident Dr. Rabbani, the patient's medical history, physical examination, diagnosis and results of pertinent tests and treatment and I agree with the patient's care as documented.  

## 2014-04-16 ENCOUNTER — Ambulatory Visit: Payer: Self-pay

## 2014-04-18 ENCOUNTER — Telehealth: Payer: Self-pay | Admitting: Gastroenterology

## 2014-05-15 ENCOUNTER — Other Ambulatory Visit: Payer: Self-pay | Admitting: *Deleted

## 2014-05-15 DIAGNOSIS — I10 Essential (primary) hypertension: Secondary | ICD-10-CM

## 2014-05-15 MED ORDER — HYDROCHLOROTHIAZIDE 12.5 MG PO CAPS: 12.5000 mg | ORAL_CAPSULE | ORAL | Status: DC

## 2014-05-15 MED ORDER — LEVOTHYROXINE SODIUM 100 MCG PO TABS: 100.0000 ug | ORAL_TABLET | Freq: Every day | ORAL | Status: DC

## 2014-06-12 ENCOUNTER — Ambulatory Visit (HOSPITAL_COMMUNITY)
Admission: RE | Admit: 2014-06-12 | Discharge: 2014-06-12 | Disposition: A | Discharge status: Home / Self Care | Payer: Self-pay | Source: Ambulatory Visit | Attending: Internal Medicine | Admitting: Internal Medicine

## 2014-06-12 ENCOUNTER — Ambulatory Visit (INDEPENDENT_AMBULATORY_CARE_PROVIDER_SITE_OTHER): Payer: Self-pay | Admitting: Internal Medicine

## 2014-06-12 ENCOUNTER — Encounter: Payer: Self-pay | Admitting: Internal Medicine

## 2014-06-12 VITALS — BP 132/79 | HR 69 | Temp 97.5°F | Ht 68.0 in | Wt 223.6 lb

## 2014-06-12 DIAGNOSIS — R0609 Other forms of dyspnea: Secondary | ICD-10-CM

## 2014-06-12 DIAGNOSIS — M1990 Unspecified osteoarthritis, unspecified site: Secondary | ICD-10-CM | POA: Insufficient documentation

## 2014-06-12 DIAGNOSIS — I10 Essential (primary) hypertension: Secondary | ICD-10-CM

## 2014-06-12 DIAGNOSIS — E785 Hyperlipidemia, unspecified: Secondary | ICD-10-CM

## 2014-06-12 DIAGNOSIS — E039 Hypothyroidism, unspecified: Secondary | ICD-10-CM

## 2014-06-12 DIAGNOSIS — Z0000 Encounter for general adult medical examination without abnormal findings: Secondary | ICD-10-CM

## 2014-06-12 DIAGNOSIS — R0602 Shortness of breath: Secondary | ICD-10-CM | POA: Insufficient documentation

## 2014-06-12 DIAGNOSIS — M159 Polyosteoarthritis, unspecified: Secondary | ICD-10-CM

## 2014-06-12 LAB — BASIC METABOLIC PANEL WITH GFR
BUN: 15 mg/dL (ref 6–23)
CHLORIDE: 103 meq/L (ref 96–112)
CO2: 30 mEq/L (ref 19–32)
Calcium: 8.9 mg/dL (ref 8.4–10.5)
Creat: 0.9 mg/dL (ref 0.50–1.35)
GFR, Est African American: >89 mL/min
GFR, Est Non African American: >89 mL/min
Glucose, Bld: 102 mg/dL — ABNORMAL HIGH (ref 70–99)
POTASSIUM: 3.8 meq/L (ref 3.5–5.3)
Sodium: 138 mEq/L (ref 135–145)

## 2014-06-12 LAB — CBC WITH DIFFERENTIAL/PLATELET
BASOS ABS: 0 10*3/uL (ref 0.0–0.1)
Basophils Relative: 0 % (ref 0–1)
EOS PCT: 3 % (ref 0–5)
Eosinophils Absolute: 0.3 10*3/uL (ref 0.0–0.7)
HEMATOCRIT: 44.7 % (ref 39.0–52.0)
Hemoglobin: 14.7 g/dL (ref 13.0–17.0)
Lymphocytes Relative: 16 % (ref 12–46)
Lymphs Abs: 1.7 10*3/uL (ref 0.7–4.0)
MCH: 28.4 pg (ref 26.0–34.0)
MCHC: 32.9 g/dL (ref 30.0–36.0)
MCV: 86.5 fL (ref 78.0–100.0)
MPV: 9.7 fL (ref 8.6–12.4)
Monocytes Absolute: 0.7 10*3/uL (ref 0.1–1.0)
Monocytes Relative: 7 % (ref 3–12)
Neutro Abs: 7.8 10*3/uL — ABNORMAL HIGH (ref 1.7–7.7)
Neutrophils Relative %: 74 % (ref 43–77)
PLATELETS: 232 10*3/uL (ref 150–400)
RBC: 5.17 MIL/uL (ref 4.22–5.81)
RDW: 13.4 % (ref 11.5–15.5)
WBC: 10.5 10*3/uL (ref 4.0–10.5)

## 2014-06-12 LAB — LIPID PANEL
CHOL/HDL RATIO: 4.3 ratio
CHOLESTEROL: 201 mg/dL — AB (ref 0–200)
HDL: 47 mg/dL (ref >39)
LDL Cholesterol: 106 mg/dL — ABNORMAL HIGH (ref 0–99)
Triglycerides: 239 mg/dL — ABNORMAL HIGH (ref <150)
VLDL: 48 mg/dL — ABNORMAL HIGH (ref 0–40)

## 2014-06-12 MED ORDER — PRAVASTATIN SODIUM 40 MG PO TABS: 40.0000 mg | ORAL_TABLET | Freq: Every evening | ORAL | Status: DC

## 2014-06-12 NOTE — Assessment & Plan Note (Signed)
Assessment: Pt with bilateral knee, wrist, and ankle pain without inflammation most likely due to degenerative changes of osteoarthritis.   Plan:  -Pt instructed to use OTC acetaminophen and/or ibuprofen PRN pain -If knee pain not improved with conservative measures, will consider intra-articular corticosteroid therapy

## 2014-06-12 NOTE — Assessment & Plan Note (Signed)
-  Refer to dentistry for poor dentition and possible denture placement  -Refer to opthalmology for chronic blurry vision  -Refer to GI for colonoscopy

## 2014-06-12 NOTE — Progress Notes (Signed)
Patient ID: Alec [name], male   DOB: 01/14/1956, 59 y.o.   MRN: [id number]    Subjective:   Patient ID: Alec [name] male   DOB: 1955-07-18 59 y.o.   MRN: [id number]  HPI: AlecAlec [name] is a 59 y.o. pleasant man with past medical history of hypertension, hyperlipidemia, hypothyroidism on replacement therapy, thalamic CVA in 2004, depression, chronic low back pain, and seasonal allergic rhinitis who presents for routine clinic visit.   He reports compliance with taking HCTZ for hypertension and reports occasional bilateral LE swelling that improves when he elevates his legs. He denies headache, chest pain, or lightheadedness.    He has chronic dyspnea on exertion without orthopnea or LE edema. His last 2D-echo in 2013 revealed no evidence of CHF. He is previous 3 ppd smoker for a few years that quit approximately 8 years ago. He has never had pulmonary function testing. His last chest xray was normal in 2013. His last CBC in 2013 did not reveal evidence of anemia.      He has chronic blurry vision and believes he needs new reading glasses. He has poor dentition with multiple missing teeth and has not seen a dentist since age 59. He denies recent tooth infection.   He was history of hypothyroidism with last TSH that was normal on 02/23/14. He reports compliance with taking levothyroxine 100 mcg daily.   He reports compliance with taking pravastatin for hyperlipidemia and denies myalgias.   He has chronic bilateral knee, ankle, and wrist pain without erythema, swelling, warmth or morning stiffness lasting <30 minutes. He takes OTC tylenol or NSAIDs with mild relief.     Past Medical History  Diagnosis Date  . Hypertension   . Depression     sees psychiatrist at behavioral health  . Hypothyroidism   . Hyperlipidemia   . Stroke 2004    acute thalamic infarct, started on aspirin at that time   Current Outpatient Prescriptions  Medication Sig Dispense Refill  . aspirin 81 MG  tablet Take 81 mg by mouth daily.      . cetirizine (ZYRTEC) 10 MG tablet Take 1 tablet (10 mg total) by mouth daily as needed for allergies. 30 tablet 2  . FLUoxetine (PROZAC) 20 MG capsule Take 20 mg by mouth daily. Prescribed by psych (Dr. Donnelly [name])    . hydrochlorothiazide (MICROZIDE) 12.5 MG capsule Take 1 capsule (12.5 mg total) by mouth every morning. 90 capsule 3  . levothyroxine (SYNTHROID, LEVOTHROID) 100 MCG tablet Take 1 tablet (100 mcg total) by mouth daily. 90 tablet 3  . meloxicam (MOBIC) 7.5 MG tablet Take 1 tablet (7.5 mg total) by mouth daily. 90 tablet 3  . pravastatin (PRAVACHOL) 40 MG tablet Take 1 tablet (40 mg total) by mouth every evening. 90 tablet 4  . topiramate (TOPAMAX) 25 MG capsule Take 25 mg by mouth daily. Prescribed by psychiatrist (Dr. Donnelly [name])     No current facility-administered medications for this visit.   Family History  Problem Relation Age of Onset  . Stroke Father    History   Social History  . Marital Status: Single    Spouse Name: N/A    Number of Children: N/A  . Years of Education: N/A   Social History Main Topics  . Smoking status: Former Smoker    Types: Cigarettes    Quit date: 05/25/1994  . Smokeless tobacco: None  . Alcohol Use: No  . Drug Use: No  . Sexual Activity: None  Other Topics Concern  . None   Social History Narrative   Review of Systems: Review of Systems  Constitutional: Positive for malaise/fatigue. Negative for fever, chills and weight loss.  HENT: Negative for congestion and sore throat.   Eyes: Positive for blurred vision (chronic). Negative for pain.  Respiratory: Negative for cough, shortness of breath and wheezing.   Cardiovascular: Positive for leg swelling (occasionally ). Negative for chest pain and orthopnea.  Gastrointestinal: Negative for nausea, vomiting, abdominal pain, diarrhea, constipation and blood in stool.  Genitourinary: Negative for dysuria, urgency, frequency and hematuria.        Erectile dysfunction   Musculoskeletal: Positive for joint pain (chronic b/l ankle, knee, and wrist pain). Negative for myalgias.  Neurological: Negative for dizziness and headaches.  Endo/Heme/Allergies: Positive for environmental allergies.  Psychiatric/Behavioral: Positive for depression (controlled on SSRI ).    Objective:  Physical Exam: Filed Vitals:   06/12/14 0830  BP: 132/79  Pulse: 69  Temp: 97.5 F (36.4 C)  TempSrc: Oral  Height: 5\' 8"  (1.727 m)  Weight: 223 lb 9.6 oz (101.424 kg)  SpO2: 98%    Physical Exam  Constitutional: He is oriented to person, place, and time. He appears well-developed and well-nourished. No distress.  HENT:  Head: Normocephalic and atraumatic.  Right Ear: External ear normal.  Left Ear: External ear normal.  Nose: Nose normal.  Mouth/Throat: Oropharynx is clear and moist. No oropharyngeal exudate.  Very poor dentition with multiple missing teeth.No signs of infection.   Eyes: Conjunctivae and EOM are normal. Pupils are equal, round, and reactive to light. Right eye exhibits no discharge. Left eye exhibits no discharge. No scleral icterus.  Neck: Normal range of motion. Neck supple.  Cardiovascular: Normal rate and regular rhythm.   Pulmonary/Chest: Effort normal and breath sounds normal. No respiratory distress. He has no wheezes. He has no rales.  Abdominal: Soft. Bowel sounds are normal. He exhibits no distension. There is no tenderness. There is no rebound and no guarding.  Musculoskeletal: Normal range of motion. He exhibits no edema or tenderness.  Neurological: He is alert and oriented to person, place, and time.  Skin: Skin is warm and dry. No rash noted. He is not diaphoretic. No erythema. No pallor.  Psychiatric: He has a normal mood and affect. His behavior is normal. Judgment and thought content normal.    Assessment & Plan:   Please see problem list for problem-based assessment and plan

## 2014-06-12 NOTE — Assessment & Plan Note (Signed)
Assessment: Pt with last lipid panel on 10/27/11 with hypercholesteremia and hypertriglyceridemia compliant with moderate intensity statin therapy with 10-yr ASCVD risk of 11.8% and lifetime risk of 50% with recommendations to continue moderate to high intensity statin therapy.   Plan:  -Obtain annual lipid panel  -Refill pravastatin 40 mg daily  -Last CMP on 02/23/14 with normal liver function -Continue to monitor for myalgias

## 2014-06-12 NOTE — Assessment & Plan Note (Signed)
Assessment: Pt with well-controlled hypertension compliant with 1-class (diruetic) anti-hypertensive therapy who presents with blood pressure of 132/79.   Plan: -BP 132/79 at goal <140/90 -Continue HCTZ 12.5 mg daily  -Obtain BMP

## 2014-06-12 NOTE — Patient Instructions (Signed)
-  Will refer you for eye exam, dental exam, and colonoscopy -Will also call you with appt for breathing test -Please go to radiology to get chest xray  -I refilled your cholesterol medication  -Will check your bloodwork today  -Take tylenol and/or ibuprofen for your joint pain -Nice seeing you today!!   General Instructions:   Thank you for bringing your medicines today. This helps [place] keep you safe from mistakes.   Progress Toward Treatment Goals:  No flowsheet data found.  Self Care Goals & Plans:  Self Care Goal 06/12/2014  Manage my medications take my medicines as prescribed; bring my medications to every visit; refill my medications on time; follow the sick day instructions if I am sick  Monitor my health keep track of my blood pressure  Eat healthy foods eat more vegetables; eat fruit for snacks and desserts; eat baked foods instead of fried foods; eat foods that are low in salt; eat smaller portions; drink diet soda or water instead of juice or soda  Be physically active find an activity I enjoy    No flowsheet data found.   Care Management & Community Referrals:  No flowsheet data found.

## 2014-06-12 NOTE — Assessment & Plan Note (Addendum)
Assessment: Pt is a past tobacco user with chronic dyspnea with exertion of unknown etiology with normal echo and CXR in 2013   Plan:  -Obtain pulmonary function testing in setting of past tobacco abuse (has never had prior testing) -Repeat chest xray (last one in 2013) ---> normal   -Obtain CBC w/diff to assess for anemia  -Consider BNP and repeat 2D-echo at next visit

## 2014-06-12 NOTE — Assessment & Plan Note (Signed)
Assessment: Pt with hypothyroidism of unknown etiology (possibly Hashimoto's) compliant with thyroid replacement therapy who presents with no symptoms of thyroid dysfunction.   Plan:  -Last TSH and free T4 normal on 02/23/14  -Continue levothyroxine 100 mcg daily -Continue to monitor for thyroid dysfunction

## 2014-06-13 NOTE — Progress Notes (Signed)
Internal Medicine Clinic Attending  Case discussed with Dr. Rabbani soon after the resident saw the patient.  We reviewed the resident's history and exam and pertinent patient test results.  I agree with the assessment, diagnosis, and plan of care documented in the resident's note.  

## 2014-06-19 ENCOUNTER — Ambulatory Visit (HOSPITAL_COMMUNITY)
Admission: RE | Admit: 2014-06-19 | Discharge: 2014-06-19 | Disposition: A | Discharge status: Home / Self Care | Payer: Self-pay | Source: Ambulatory Visit | Attending: Internal Medicine | Admitting: Internal Medicine

## 2014-06-19 DIAGNOSIS — R0609 Other forms of dyspnea: Secondary | ICD-10-CM | POA: Insufficient documentation

## 2014-06-19 LAB — PULMONARY FUNCTION TEST
DL/VA % pred: 113 %
DL/VA: 5.1 ml/min/mmHg/L
DLCO COR: 20.68 ml/min/mmHg
DLCO cor % pred: 69 %
DLCO unc % pred: 69 %
DLCO unc: 20.68 ml/min/mmHg
FEF 25-75 Post: 3.57 L/sec
FEF 25-75 Pre: 2.34 L/sec
FEF2575-%Change-Post: 52 %
FEF2575-%Pred-Post: 126 %
FEF2575-%Pred-Pre: 83 %
FEV1-%CHANGE-POST: 16 %
FEV1-%PRED-POST: 73 %
FEV1-%PRED-PRE: 63 %
FEV1-PRE: 2.14 L
FEV1-Post: 2.49 L
FEV1FVC-%Change-Post: 4 %
FEV1FVC-%PRED-PRE: 105 %
FEV6-%CHANGE-POST: 13 %
FEV6-%PRED-POST: 69 %
FEV6-%Pred-Pre: 61 %
FEV6-POST: 2.95 L
FEV6-Pre: 2.59 L
FEV6FVC-%Pred-Post: 105 %
FEV6FVC-%Pred-Pre: 105 %
FVC-%Change-Post: 11 %
FVC-%PRED-POST: 66 %
FVC-%PRED-PRE: 59 %
FVC-Post: 2.95 L
FVC-Pre: 2.66 L
Post FEV1/FVC ratio: 84 %
Post FEV6/FVC ratio: 100 %
Pre FEV1/FVC ratio: 80 %
Pre FEV6/FVC Ratio: 100 %
RV % pred: 75 %
RV: 1.6 L
TLC % pred: 66 %
TLC: 4.39 L

## 2014-06-19 MED ORDER — ALBUTEROL SULFATE (2.5 MG/3ML) 0.083% IN NEBU
2.5000 mg | INHALATION_SOLUTION | Freq: Once | RESPIRATORY_TRACT | Status: AC
  Administered 2014-06-19: 2.5 mg via RESPIRATORY_TRACT

## 2014-08-09 ENCOUNTER — Other Ambulatory Visit: Payer: Self-pay | Admitting: Gastroenterology

## 2014-10-25 ENCOUNTER — Ambulatory Visit: Payer: Self-pay

## 2014-10-29 ENCOUNTER — Encounter (HOSPITAL_COMMUNITY): Admission: RE | Payer: Self-pay | Source: Ambulatory Visit

## 2014-10-29 ENCOUNTER — Ambulatory Visit (HOSPITAL_COMMUNITY): Admission: RE | Admit: 2014-10-29 | Payer: Self-pay | Source: Ambulatory Visit | Admitting: Gastroenterology

## 2014-10-29 SURGERY — COLONOSCOPY WITH PROPOFOL
Anesthesia: Monitor Anesthesia Care

## 2015-01-17 NOTE — Telephone Encounter (Signed)
Error

## 2015-05-10 ENCOUNTER — Other Ambulatory Visit: Payer: Self-pay | Admitting: *Deleted

## 2015-05-10 DIAGNOSIS — I10 Essential (primary) hypertension: Secondary | ICD-10-CM

## 2015-05-10 MED ORDER — LEVOTHYROXINE SODIUM 100 MCG PO TABS: 100.0000 ug | ORAL_TABLET | Freq: Every day | ORAL | Status: DC

## 2015-05-10 MED ORDER — HYDROCHLOROTHIAZIDE 12.5 MG PO CAPS: 12.5000 mg | ORAL_CAPSULE | ORAL | Status: DC

## 2015-05-21 ENCOUNTER — Encounter: Payer: Self-pay | Admitting: Internal Medicine

## 2015-07-15 ENCOUNTER — Ambulatory Visit (INDEPENDENT_AMBULATORY_CARE_PROVIDER_SITE_OTHER): Payer: Self-pay | Admitting: Internal Medicine

## 2015-07-15 ENCOUNTER — Encounter: Payer: Self-pay | Admitting: Internal Medicine

## 2015-07-15 VITALS — BP 126/72 | HR 77 | Temp 98.4°F | Wt 231.2 lb

## 2015-07-15 DIAGNOSIS — Z0000 Encounter for general adult medical examination without abnormal findings: Secondary | ICD-10-CM

## 2015-07-15 DIAGNOSIS — R0609 Other forms of dyspnea: Secondary | ICD-10-CM

## 2015-07-15 DIAGNOSIS — I10 Essential (primary) hypertension: Secondary | ICD-10-CM

## 2015-07-15 DIAGNOSIS — E785 Hyperlipidemia, unspecified: Secondary | ICD-10-CM

## 2015-07-15 DIAGNOSIS — Z79899 Other long term (current) drug therapy: Secondary | ICD-10-CM

## 2015-07-15 DIAGNOSIS — Z87891 Personal history of nicotine dependence: Secondary | ICD-10-CM

## 2015-07-15 DIAGNOSIS — E039 Hypothyroidism, unspecified: Secondary | ICD-10-CM

## 2015-07-15 MED ORDER — ALBUTEROL SULFATE HFA 108 (90 BASE) MCG/ACT IN AERS: 2.0000 | INHALATION_SPRAY | Freq: Four times a day (QID) | RESPIRATORY_TRACT | Status: DC | PRN

## 2015-07-15 MED ORDER — HYDROCHLOROTHIAZIDE 12.5 MG PO CAPS: 12.5000 mg | ORAL_CAPSULE | ORAL | Status: DC

## 2015-07-15 MED ORDER — PRAVASTATIN SODIUM 40 MG PO TABS: 40.0000 mg | ORAL_TABLET | Freq: Every evening | ORAL | Status: DC

## 2015-07-15 NOTE — Assessment & Plan Note (Addendum)
Assessment: Pt with last lipid panel on 06/12/14 LDL 106 compliant with moderate-intensity statin therapy who is in statin benefit group in setting of clinical ASCVD (history of CVA) with recommendations to continue moderate to high intensity statin therapy.   Plan:  -Obtain annual lipid panel  -Continue pravastatin 40 mg daily, may need to change to high intensity statin  -Obtain CMP to assess liver function -Continue to monitor for myalgias  ADDENDUM on 07/16/15: Pt with worsening LDL, will change pravastatin to high intensity statin lipitor 40 mg daily

## 2015-07-15 NOTE — Patient Instructions (Addendum)
-  Will check your bloodwork and call you with results. -Please fill in the stool cards and send them in to [place] -Will refill your meds.  Will refill your synthroid once I get the TSH back. -Once you get the orange card will refer you to the lung doctor -Pleasure seeing you again, please come back in 1 year or sooner if you have any problems  General Instructions:   Thank you for bringing your medicines today. This helps [place] keep you safe from mistakes.   Progress Toward Treatment Goals:  No flowsheet data found.  Self Care Goals & Plans:  Self Care Goal 06/12/2014  Manage my medications take my medicines as prescribed; bring my medications to every visit; refill my medications on time; follow the sick day instructions if I am sick  Monitor my health keep track of my blood pressure  Eat healthy foods eat more vegetables; eat fruit for snacks and desserts; eat baked foods instead of fried foods; eat foods that are low in salt; eat smaller portions; drink diet soda or water instead of juice or soda  Be physically active find an activity I enjoy    No flowsheet data found.   Care Management & Community Referrals:  No flowsheet data found.

## 2015-07-15 NOTE — Assessment & Plan Note (Signed)
-  Pt declined influenza and zoster vaccinations today -Pt declined screening colonoscopy, instead given stool cards to complete at home -Obtain screening Hepatitis C Ab (born b/w 1946-65)

## 2015-07-15 NOTE — Assessment & Plan Note (Signed)
Assessment: Pt with hypothyroidism of unknown etiology (possibly Hashimoto's) compliant with thyroid replacement therapy who presents with no symptoms of thyroid dysfunction.   Plan:  -Obtain TSH level -Continue levothyroxine 100 mcg daily and adjust based on TSH level

## 2015-07-15 NOTE — Assessment & Plan Note (Signed)
Assessment: Pt with well-controlled hypertension compliant with one-class (diruetic) anti-hypertensive therapy who presents with blood pressure of 126/72.   Plan: -BP 126/72 at goal <140/90 -Continue HCTZ 12.5 mg daily  -Obtain CMP

## 2015-07-15 NOTE — Progress Notes (Signed)
Patient ID: Alec [name], male   DOB: 1955/11/29, 60 y.o.   MRN: [id number]    Subjective:   Patient ID: Alec [name] male   DOB: 10-Sep-1955 60 y.o.   MRN: [id number]  HPI: Alec [name] is a 60 y.o. pleasant man with past medical history of hypertension, hyperlipidemia, hypothyroidism on replacement therapy, thalamic CVA in 2004, depression, chronic low back pain, and seasonal allergic rhinitis who presents for medication refills.   He reports compliance with taking HCTZ for hypertension. He denies blurry vision from baseline, headache, chest pain, or lightheadedness.   He has chronic dyspnea on exertion without orthopnea or LE edema. His last 2D-echo in 2013 revealed no evidence of CHF. He is a previous 3 ppd smoker for a few years that quit approximately 8 years ago. He had pulmonary function testing on 06/19/14 that revealed restrictive defect suggesting possible interstitial fibrosis or inflammation. His last chest xray on 06/12/14 was normal. His last CBC in 2016 did not reveal evidence of anemia.   He was history of hypothyroidism with last TSH that was normal on 02/23/14. He reports compliance with taking levothyroxine 100 mcg daily. He reports sometimes feeling warm but denies other symptoms of thyroid dysfunction.   He reports compliance with taking pravastatin for hyperlipidemia and denies myalgias.   He denies influenza and zoster vaccinations. He is agreeable to stool card testing. He was not able to afford screening colonoscopy.     Past Medical History  Diagnosis Date  . Hypertension   . Depression     sees psychiatrist at behavioral health  . Hypothyroidism   . Hyperlipidemia   . Stroke [facility]) 2004    acute thalamic infarct, started on aspirin at that time   Current Outpatient Prescriptions  Medication Sig Dispense Refill  . aspirin 81 MG tablet Take 81 mg by mouth daily.      . cetirizine (ZYRTEC) 10 MG tablet Take 1 tablet (10 mg total) by mouth daily as  needed for allergies. 30 tablet 2  . FLUoxetine (PROZAC) 20 MG capsule Take 20 mg by mouth daily. Prescribed by psych (Dr. Donnelly Angelica)    . hydrochlorothiazide (MICROZIDE) 12.5 MG capsule Take 1 capsule (12.5 mg total) by mouth every morning. 90 capsule 0  . levothyroxine (SYNTHROID, LEVOTHROID) 100 MCG tablet Take 1 tablet (100 mcg total) by mouth daily. 90 tablet 0  . pravastatin (PRAVACHOL) 40 MG tablet Take 1 tablet (40 mg total) by mouth every evening. 90 tablet 4  . topiramate (TOPAMAX) 25 MG capsule Take 25 mg by mouth daily. Prescribed by psychiatrist (Dr. Donnelly Angelica)     No current facility-administered medications for this visit.   Family History  Problem Relation Age of Onset  . Stroke Father    Social History   Social History  . Marital Status: Single    Spouse Name: N/A  . Number of Children: N/A  . Years of Education: N/A   Social History Main Topics  . Smoking status: Former Smoker    Types: Cigarettes    Quit date: 05/25/1994  . Smokeless tobacco: None  . Alcohol Use: No  . Drug Use: No  . Sexual Activity: Not Asked   Other Topics Concern  . None   Social History Narrative   Review of Systems: Review of Systems  Constitutional: Positive for weight loss (intentional 10 lb in 13 months). Negative for fever and chills.       Occasionally feeling warm  Eyes: Positive for  blurred vision (chronic).  Respiratory: Positive for shortness of breath. Negative for cough and wheezing.   Cardiovascular: Negative for chest pain and leg swelling.  Gastrointestinal: Negative for nausea, vomiting, abdominal pain, diarrhea, constipation and blood in stool.  Genitourinary: Negative for dysuria, urgency, frequency and hematuria.  Musculoskeletal: Positive for joint pain (OA of multiple joints) and falls (3 weeks ago). Negative for myalgias.  Neurological: Negative for dizziness and headaches.       Occasional leg jerking  Psychiatric/Behavioral: Positive for depression  (controlled on medical therapy).     Objective:  Physical Exam: Filed Vitals:   07/15/15 1031  BP: 126/72  Pulse: 77  Temp: 98.4 F (36.9 C)  TempSrc: Oral  Weight: 231 lb 3.2 oz (104.872 kg)  SpO2: 98%    Physical Exam  Constitutional: He is oriented to person, place, and time. He appears well-developed and well-nourished. No distress.  HENT:  Head: Normocephalic and atraumatic.  Right Ear: External ear normal.  Left Ear: External ear normal.  Nose: Nose normal.  Mouth/Throat: Oropharynx is clear and moist. No oropharyngeal exudate.  Eyes: Conjunctivae and EOM are normal. Pupils are equal, round, and reactive to light. Right eye exhibits no discharge. Left eye exhibits no discharge. No scleral icterus.  Neck: Normal range of motion. Neck supple. No thyromegaly present.  Cardiovascular: Normal rate, regular rhythm and normal heart sounds.   Pulmonary/Chest: Effort normal and breath sounds normal. No respiratory distress. He has no wheezes. He has no rales.  Abdominal: Soft. Bowel sounds are normal. He exhibits no distension. There is no tenderness. There is no rebound and no guarding.  Musculoskeletal: Normal range of motion. He exhibits no edema or tenderness.  Neurological: He is alert and oriented to person, place, and time.  Skin: Skin is warm and dry. No rash noted. He is not diaphoretic. No erythema. No pallor.  Psychiatric: He has a normal mood and affect. His behavior is normal. Judgment and thought content normal.    Assessment & Plan:   Please see problem list for problem-based assessment and plan

## 2015-07-15 NOTE — Assessment & Plan Note (Signed)
Assessment: Pt is a past tobacco user with chronic dyspnea in setting of restrictive lung disease most likely due to pulmonary fibrosis vs interstitial inflammation.    Plan:  -Once pt acquires orange card will refer to pulmonology (cannot pay out of pocket) -Prescribe albterol 2 puffs Q 6 hr PRN in setting of slight response to bronchodilator on PFT's

## 2015-07-16 LAB — LIPID PANEL
Chol/HDL Ratio: 4.8 ratio units (ref 0.0–5.0)
Cholesterol, Total: 225 mg/dL — ABNORMAL HIGH (ref 100–199)
HDL: 47 mg/dL (ref >39)
LDL Calculated: 128 mg/dL — ABNORMAL HIGH (ref 0–99)
Triglycerides: 251 mg/dL — ABNORMAL HIGH (ref 0–149)
VLDL CHOLESTEROL CAL: 50 mg/dL — AB (ref 5–40)

## 2015-07-16 LAB — CMP14 + ANION GAP
ALT: 18 IU/L (ref 0–44)
ANION GAP: 18 mmol/L (ref 10.0–18.0)
AST: 14 IU/L (ref 0–40)
Albumin/Globulin Ratio: 1.7 (ref 1.1–2.5)
Albumin: 4 g/dL (ref 3.6–4.8)
Alkaline Phosphatase: 90 IU/L (ref 39–117)
BILIRUBIN TOTAL: 0.3 mg/dL (ref 0.0–1.2)
BUN/Creatinine Ratio: 15 (ref 10–22)
BUN: 16 mg/dL (ref 8–27)
CALCIUM: 9.5 mg/dL (ref 8.6–10.2)
CHLORIDE: 100 mmol/L (ref 96–106)
CO2: 25 mmol/L (ref 18–29)
CREATININE: 1.04 mg/dL (ref 0.76–1.27)
GFR, EST AFRICAN AMERICAN: 90 mL/min/{1.73_m2} (ref >59)
GFR, EST NON AFRICAN AMERICAN: 78 mL/min/{1.73_m2} (ref >59)
GLUCOSE: 79 mg/dL (ref 65–99)
Globulin, Total: 2.3 g/dL (ref 1.5–4.5)
Potassium: 4.2 mmol/L (ref 3.5–5.2)
Sodium: 143 mmol/L (ref 134–144)
TOTAL PROTEIN: 6.3 g/dL (ref 6.0–8.5)

## 2015-07-16 LAB — TSH: TSH: 1.74 u[IU]/mL (ref 0.450–4.500)

## 2015-07-16 LAB — HEPATITIS C ANTIBODY

## 2015-07-16 MED ORDER — ATORVASTATIN CALCIUM 40 MG PO TABS: 40.0000 mg | ORAL_TABLET | Freq: Every day | ORAL | Status: DC

## 2015-07-16 MED ORDER — LEVOTHYROXINE SODIUM 100 MCG PO TABS: 100.0000 ug | ORAL_TABLET | Freq: Every day | ORAL | Status: DC

## 2015-07-16 NOTE — Progress Notes (Signed)
Internal Medicine Clinic Attending  Case discussed with Dr. Rabbani soon after the resident saw the patient.  We reviewed the resident's history and exam and pertinent patient test results.  I agree with the assessment, diagnosis, and plan of care documented in the resident's note.  

## 2015-07-16 NOTE — Addendum Note (Signed)
Addended byJuluis Mire on: 07/16/2015 11:57 AM   Modules accepted: Orders, Medications

## 2015-07-23 ENCOUNTER — Other Ambulatory Visit (INDEPENDENT_AMBULATORY_CARE_PROVIDER_SITE_OTHER): Payer: Self-pay

## 2015-07-23 DIAGNOSIS — Z0000 Encounter for general adult medical examination without abnormal findings: Secondary | ICD-10-CM

## 2015-07-23 DIAGNOSIS — Z1211 Encounter for screening for malignant neoplasm of colon: Secondary | ICD-10-CM

## 2015-07-23 LAB — POC HEMOCCULT BLD/STL (HOME/3-CARD/SCREEN)
Card #2 Fecal Occult Blod, POC: NEGATIVE
FECAL OCCULT BLD: NEGATIVE
FECAL OCCULT BLD: NEGATIVE

## 2015-11-13 ENCOUNTER — Encounter: Payer: Self-pay | Admitting: *Deleted

## 2015-11-18 ENCOUNTER — Other Ambulatory Visit: Payer: Self-pay | Admitting: General Practice

## 2015-11-18 DIAGNOSIS — J8410 Pulmonary fibrosis, unspecified: Secondary | ICD-10-CM

## 2015-12-17 ENCOUNTER — Other Ambulatory Visit: Payer: Self-pay | Admitting: General Practice

## 2015-12-17 ENCOUNTER — Ambulatory Visit (HOSPITAL_COMMUNITY): Payer: Disability Insurance

## 2015-12-17 ENCOUNTER — Ambulatory Visit
Admission: RE | Admit: 2015-12-17 | Discharge: 2015-12-17 | Disposition: A | Discharge status: Home / Self Care | Payer: Disability Insurance | Source: Ambulatory Visit | Attending: General Practice | Admitting: General Practice

## 2015-12-17 DIAGNOSIS — M1990 Unspecified osteoarthritis, unspecified site: Secondary | ICD-10-CM

## 2015-12-17 DIAGNOSIS — M47896 Other spondylosis, lumbar region: Secondary | ICD-10-CM | POA: Diagnosis not present

## 2015-12-17 DIAGNOSIS — M778 Other enthesopathies, not elsewhere classified: Secondary | ICD-10-CM | POA: Diagnosis not present

## 2015-12-17 DIAGNOSIS — J8410 Pulmonary fibrosis, unspecified: Secondary | ICD-10-CM

## 2015-12-17 DIAGNOSIS — M5137 Other intervertebral disc degeneration, lumbosacral region: Secondary | ICD-10-CM | POA: Insufficient documentation

## 2015-12-17 DIAGNOSIS — M170 Bilateral primary osteoarthritis of knee: Secondary | ICD-10-CM | POA: Insufficient documentation

## 2016-07-21 ENCOUNTER — Other Ambulatory Visit: Payer: Self-pay | Admitting: Internal Medicine

## 2016-07-21 DIAGNOSIS — I10 Essential (primary) hypertension: Secondary | ICD-10-CM

## 2016-07-21 DIAGNOSIS — E039 Hypothyroidism, unspecified: Secondary | ICD-10-CM

## 2016-07-21 DIAGNOSIS — E785 Hyperlipidemia, unspecified: Secondary | ICD-10-CM

## 2016-08-03 ENCOUNTER — Other Ambulatory Visit: Payer: Self-pay | Admitting: Internal Medicine

## 2016-08-03 DIAGNOSIS — E785 Hyperlipidemia, unspecified: Secondary | ICD-10-CM

## 2016-08-03 DIAGNOSIS — I10 Essential (primary) hypertension: Secondary | ICD-10-CM

## 2016-08-03 DIAGNOSIS — E039 Hypothyroidism, unspecified: Secondary | ICD-10-CM

## 2016-08-03 NOTE — Telephone Encounter (Signed)
Will approve but need patient in [facility] for 1 year check up.

## 2016-08-28 ENCOUNTER — Ambulatory Visit (INDEPENDENT_AMBULATORY_CARE_PROVIDER_SITE_OTHER): Payer: Self-pay | Admitting: Internal Medicine

## 2016-08-28 ENCOUNTER — Encounter: Payer: Self-pay | Admitting: Internal Medicine

## 2016-08-28 VITALS — BP 139/79 | HR 72 | Temp 97.9°F | Ht 68.0 in | Wt 224.3 lb

## 2016-08-28 DIAGNOSIS — F329 Major depressive disorder, single episode, unspecified: Secondary | ICD-10-CM

## 2016-08-28 DIAGNOSIS — I10 Essential (primary) hypertension: Secondary | ICD-10-CM

## 2016-08-28 DIAGNOSIS — Z0000 Encounter for general adult medical examination without abnormal findings: Secondary | ICD-10-CM

## 2016-08-28 DIAGNOSIS — F32A Depression, unspecified: Secondary | ICD-10-CM

## 2016-08-28 DIAGNOSIS — E039 Hypothyroidism, unspecified: Secondary | ICD-10-CM

## 2016-08-28 DIAGNOSIS — Z87891 Personal history of nicotine dependence: Secondary | ICD-10-CM

## 2016-08-28 DIAGNOSIS — R0609 Other forms of dyspnea: Secondary | ICD-10-CM

## 2016-08-28 DIAGNOSIS — Z79899 Other long term (current) drug therapy: Secondary | ICD-10-CM

## 2016-08-28 DIAGNOSIS — E785 Hyperlipidemia, unspecified: Secondary | ICD-10-CM

## 2016-08-28 MED ORDER — ATORVASTATIN CALCIUM 40 MG PO TABS: 40.0000 mg | ORAL_TABLET | Freq: Every day | ORAL | 1 refills | Status: DC

## 2016-08-28 MED ORDER — LEVOTHYROXINE SODIUM 100 MCG PO TABS: 100.0000 ug | ORAL_TABLET | Freq: Every day | ORAL | 1 refills | Status: DC

## 2016-08-28 MED ORDER — HYDROCHLOROTHIAZIDE 12.5 MG PO CAPS: 12.5000 mg | ORAL_CAPSULE | Freq: Every morning | ORAL | 1 refills | Status: DC

## 2016-08-28 MED ORDER — ALBUTEROL SULFATE HFA 108 (90 BASE) MCG/ACT IN AERS: 2.0000 | INHALATION_SPRAY | Freq: Four times a day (QID) | RESPIRATORY_TRACT | 1 refills | Status: DC | PRN

## 2016-08-28 NOTE — Patient Instructions (Signed)
General Instructions: - Refills made for all of your medications - Can start albuterol 1-2 puffs as needed for shortness of breath or wheezing - Please send in stool cards - Follow up in 6 months to see how you respond to albuterol   Thank you for bringing your medicines today. This helps [place] keep you safe from mistakes.   Progress Toward Treatment Goals:  No flowsheet data found.  Self Care Goals & Plans:  Self Care Goal 06/12/2014  Manage my medications take my medicines as prescribed; bring my medications to every visit; refill my medications on time; follow the sick day instructions if I am sick  Monitor my health keep track of my blood pressure  Eat healthy foods eat more vegetables; eat fruit for snacks and desserts; eat baked foods instead of fried foods; eat foods that are low in salt; eat smaller portions; drink diet soda or water instead of juice or soda  Be physically active find an activity I enjoy    No flowsheet data found.   Care Management & Community Referrals:  No flowsheet data found.

## 2016-08-28 NOTE — Assessment & Plan Note (Signed)
No symptoms of thyroid dysfunction. Continue levothyroxine 100 mjcg daily. Will check TSH today.

## 2016-08-28 NOTE — Assessment & Plan Note (Signed)
His depression is stable and he is doing well with weekly therapy. His fluoxetine and Topamax are being managed by his psychiatrist.

## 2016-08-28 NOTE — Assessment & Plan Note (Signed)
He reports his dyspnea on exertion has not changed over the past year. He does note occasional substernal chest pain with exertion. He states the chest pain does not occur every time he exerts himself. He reports he had a stress test in the past, but I do not see this in his records. He may benefit from further cardiac workup. He did have an echo in 2013 which showed a normal left ventricular ejection fraction. Will have him try a trial of albuterol as needed for shortness of breath or wheezing. We reviewed how to use this medication properly. He will follow-up in 6 months to assess his response to albuterol and possibly further cardiac workup if deemed necessary.

## 2016-08-28 NOTE — Assessment & Plan Note (Addendum)
BP is well-controlled. Continue HCTZ 12.5 mg daily, this was refilled today. Will check a bmet.

## 2016-08-28 NOTE — Assessment & Plan Note (Signed)
Patient was given stool cards. He will mail these in.

## 2016-08-28 NOTE — Progress Notes (Signed)
   CC: Hypertension follow-up  HPI:  [name] is a 61 y.o. man with past medical history as noted below who presents today for follow-up of his hypertension.  HTN: BP today is 139/79. He is taking HCTZ 12.5 mg daily. He denies any dizziness or lightheadedness.  Hypothyroidism: The etiology for his hypothyroidism is unknown. He is on Synthroid 100 mcg daily. He denies any weight changes, skin/hair/nails changes, anxiety, constipation, or diarrhea.  Hyperlipidemia: He is taking Lipitor 40 mg daily. He denies any muscle cramps.  Depression: Reports he goes to weekly therapy and is doing well with this. He is on fluoxetine 20 mg daily and Topamax 25 mg daily, which are prescribed by his psychiatrist Dr. Berneta [name].  Dyspnea on exertion: Reports his shortness of breath on exertion is unchanged from prior. He does note he will occasionally get chest pain in the middle of his chest when hurrying or lifting heavy objects. He does note occasional wheezing. He has not been using albuterol as he never received a prescription.  Past Medical History:  Diagnosis Date  . Depression    sees psychiatrist at behavioral health  . Hyperlipidemia   . Hypertension   . Hypothyroidism   . Stroke [facility]) 2004   acute thalamic infarct, started on aspirin at that time    Review of Systems:   General: Denies fever, chills, night sweats, changes in appetite HEENT: Denies headaches, ear pain, changes in vision, rhinorrhea, sore throat CV: Denies palpitations, orthopnea Pulm: Denies cough GI: Denies abdominal pain, nausea, vomiting, diarrhea, constipation, melena, hematochezia GU: Denies dysuria, hematuria, frequency Msk: Denies muscle cramps, joint pains Neuro: Denies weakness, numbness, tingling Skin: Denies rashes, bruising Psych: Denies hallucinations  Physical Exam:  Vitals:   08/28/16 0938  BP: 139/79  Pulse: 72  Temp: 97.9 F (36.6 C)  TempSrc: Oral  SpO2: 99%  Weight: 224 lb 4.8 oz  (101.7 kg)  Height: 5\' 8"  (1.727 m)   General: Well-nourished man, NAD HEENT: [place]/AT, EOMI, sclera anicteric, mucus membranes moist CV: RRR, no m/g/r Pulm: Mild end-expiratory wheezes bilaterally, breaths non-labored Ext: Warm, no peripheral edema, full ROM Neuro: alert and oriented x 3. No focal deficits.  Assessment & Plan:   See Encounters Tab for problem based charting.  Patient discussed with Dr. Angelia [name]

## 2016-08-28 NOTE — Assessment & Plan Note (Signed)
He is not having any side effects from Lipitor. Will check a repeat lipid panel today and continue Lipitor 40 mg daily.

## 2016-08-29 LAB — BMP8+ANION GAP
Anion Gap: 15 mmol/L (ref 10.0–18.0)
BUN/Creatinine Ratio: 13 (ref 10–24)
BUN: 13 mg/dL (ref 8–27)
CALCIUM: 9.2 mg/dL (ref 8.6–10.2)
CO2: 26 mmol/L (ref 18–29)
CREATININE: 1.03 mg/dL (ref 0.76–1.27)
Chloride: 101 mmol/L (ref 96–106)
GFR calc Af Amer: 90 mL/min/{1.73_m2} (ref >59)
GFR, EST NON AFRICAN AMERICAN: 78 mL/min/{1.73_m2} (ref >59)
Glucose: 92 mg/dL (ref 65–99)
Potassium: 3.8 mmol/L (ref 3.5–5.2)
Sodium: 142 mmol/L (ref 134–144)

## 2016-08-29 LAB — LIPID PANEL
CHOL/HDL RATIO: 3.6 ratio (ref 0.0–5.0)
Cholesterol, Total: 163 mg/dL (ref 100–199)
HDL: 45 mg/dL (ref >39)
LDL CALC: 90 mg/dL (ref 0–99)
TRIGLYCERIDES: 139 mg/dL (ref 0–149)
VLDL CHOLESTEROL CAL: 28 mg/dL (ref 5–40)

## 2016-08-29 LAB — TSH: TSH: 1.9 u[IU]/mL (ref 0.450–4.500)

## 2016-09-01 NOTE — Progress Notes (Signed)
Internal Medicine Clinic Attending  Case discussed with Dr. Rivet at the time of the visit.  We reviewed the resident's history and exam and pertinent patient test results.  I agree with the assessment, diagnosis, and plan of care documented in the resident's note.  

## 2016-12-01 ENCOUNTER — Encounter: Payer: Self-pay | Admitting: Internal Medicine
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
## 2017-04-29 ENCOUNTER — Other Ambulatory Visit: Payer: Self-pay | Admitting: *Deleted

## 2017-04-29 DIAGNOSIS — I10 Essential (primary) hypertension: Secondary | ICD-10-CM

## 2017-04-29 DIAGNOSIS — E039 Hypothyroidism, unspecified: Secondary | ICD-10-CM

## 2017-04-29 MED ORDER — LEVOTHYROXINE SODIUM 100 MCG PO TABS: 100.0000 ug | ORAL_TABLET | Freq: Every day | ORAL | 1 refills | Status: DC

## 2017-04-29 MED ORDER — HYDROCHLOROTHIAZIDE 12.5 MG PO CAPS: 12.5000 mg | ORAL_CAPSULE | Freq: Every morning | ORAL | 1 refills | Status: DC

## 2017-06-21 ENCOUNTER — Other Ambulatory Visit: Payer: Self-pay | Admitting: Internal Medicine

## 2017-06-21 DIAGNOSIS — E785 Hyperlipidemia, unspecified: Secondary | ICD-10-CM

## 2017-07-19 NOTE — Progress Notes (Signed)
    CC: Hypertension, HLD, HTN follow-up  HPI:  [name] is a very-pleasant 62 y.o. man with past medical history as noted below who presents today for follow-up of his hypertension, hypothyroidism and HLD.   HTN: Most recent renal function normal 4/18. He is taking HCTZ 12.5 mg daily, historically well controlled.   Hypothyroidism: He is on Synthroid 100 mcg daily. Most recent TSH 1.9.   Hyperlipidemia: He is taking Lipitor 40 mg daily. Lipid panel on this regimen 4/18 with LDL 90. He has history of CVA.   Depression: Reports he goes to weekly therapy and is doing well with this. He is on fluoxetine 20 mg daily and Topamax 25 mg daily, which are prescribed by his psychiatrist.  *Fun fact: Patient talked a lot today about gardening, recent greenhouse addition as well as interest in hydroponics.   Past Medical History:  Diagnosis Date  . Depression    sees psychiatrist at behavioral health  . Hyperlipidemia   . Hypertension   . Hypothyroidism   . Stroke [facility]) 2004   acute thalamic infarct, started on aspirin at that time   Review of Systems:   General: Denies fever, chills, night sweats, changes in appetite HEENT: Denies headaches, changes in vision, sore throat CV: Denies chest pain, palpitations, orthopnea Pulm: Denies cough, DOE GI: Denies abdominal pain, melena, hematochezia GU: Denies dysuria, hematuria, frequency Msk: Denies muscle pain Neuro: Denies weakness, numbness, tingling  Physical Exam:  Vitals:   07/20/17 1454  BP: 134/80  Pulse: 66  Temp: 98 F (36.7 C)  TempSrc: Oral  SpO2: 100%  Weight: 219 lb 12.8 oz (99.7 kg)   General: Well-nourished caucasian man, NAD HEENT: [place]/AT, EOMI, sclera anicteric, mucus membranes moist. +Glasses.  CV: RRR, no m/g/r Pulm: CTA BL with normal WOB on RA. Ext: Warm, no peripheral edema. No erythema. Neuro: Alert and oriented x 3. No focal deficits. Psych: Very pleasant. Responds to questions appropriately.    Assessment & Plan:   See Encounters Tab for problem based charting.  Patient discussed with Dr. Evette Doffing

## 2017-07-20 ENCOUNTER — Encounter: Payer: Self-pay | Admitting: Internal Medicine

## 2017-07-20 ENCOUNTER — Other Ambulatory Visit: Payer: Self-pay

## 2017-07-20 ENCOUNTER — Ambulatory Visit: Payer: Self-pay | Admitting: Internal Medicine

## 2017-07-20 VITALS — BP 134/80 | HR 66 | Temp 98.0°F | Wt 219.8 lb

## 2017-07-20 DIAGNOSIS — E039 Hypothyroidism, unspecified: Secondary | ICD-10-CM

## 2017-07-20 DIAGNOSIS — Z79899 Other long term (current) drug therapy: Secondary | ICD-10-CM

## 2017-07-20 DIAGNOSIS — Z973 Presence of spectacles and contact lenses: Secondary | ICD-10-CM

## 2017-07-20 DIAGNOSIS — Z79890 Hormone replacement therapy (postmenopausal): Secondary | ICD-10-CM

## 2017-07-20 DIAGNOSIS — Z1211 Encounter for screening for malignant neoplasm of colon: Secondary | ICD-10-CM

## 2017-07-20 DIAGNOSIS — F339 Major depressive disorder, recurrent, unspecified: Secondary | ICD-10-CM

## 2017-07-20 DIAGNOSIS — Z8673 Personal history of transient ischemic attack (TIA), and cerebral infarction without residual deficits: Secondary | ICD-10-CM

## 2017-07-20 DIAGNOSIS — E785 Hyperlipidemia, unspecified: Secondary | ICD-10-CM

## 2017-07-20 DIAGNOSIS — Z7982 Long term (current) use of aspirin: Secondary | ICD-10-CM

## 2017-07-20 DIAGNOSIS — I10 Essential (primary) hypertension: Secondary | ICD-10-CM

## 2017-07-20 MED ORDER — LEVOTHYROXINE SODIUM 100 MCG PO TABS: 100.0000 ug | ORAL_TABLET | Freq: Every day | ORAL | 3 refills | Status: DC

## 2017-07-20 MED ORDER — HYDROCHLOROTHIAZIDE 12.5 MG PO CAPS: 12.5000 mg | ORAL_CAPSULE | Freq: Every morning | ORAL | 3 refills | Status: DC

## 2017-07-20 MED ORDER — ATORVASTATIN CALCIUM 40 MG PO TABS: 40.0000 mg | ORAL_TABLET | Freq: Every day | ORAL | 2 refills | Status: DC

## 2017-07-20 NOTE — Assessment & Plan Note (Signed)
Patient resistant to [facility] screening today however did provide stool cards for patient to send-in to clinic should he change his mind.  -FIT testing ordered

## 2017-07-20 NOTE — Assessment & Plan Note (Signed)
Notes compliance with Synthroid 113mcg daily. Most recent TSH 4/18 1.9. Denies any excessive fatigue, changes in weight, racing heart or anxiety.  -Check TSH -Rx for 151mcg Synthroid daily sent to pharmacy

## 2017-07-20 NOTE — Assessment & Plan Note (Signed)
Hx of Acute/subacute ischemic right thalamic CVA in 2004. No residual deficit. Chronically on ASA and statin which he is compliant with. Notes no weakness or numbness.  -Continue to maximize medical therapy  -Continue ASA, statin, BP control

## 2017-07-20 NOTE — Assessment & Plan Note (Signed)
LDL 90 last year while on current statin regimen. Has history of remote CVA without residual deficits. Discussed importance of compliance with this medication and cholesterol reduction. He denies any adverse effects.  -Continue high-intensity statin for secondary prevention -Rx sent for Atorvastatin 40mg 

## 2017-07-20 NOTE — Assessment & Plan Note (Signed)
Continues to follow with psychiatry outpatient. Doing well on Prozac and Topamax.

## 2017-07-20 NOTE — Patient Instructions (Addendum)
Mr. Grochowski, it was a pleasure meeting you today! I'm glad you are doing well!  Today we talked about your chronic medical conditions. Your blood pressure is well controlled and you are doing a good job taking your medications.   I have ordered blood tests to make sure your thyroid function is doing OK on your current dose of Synthroid as well as to check your kidney function.   I have ordered the colon cancer screening card, please send it in if you change your mind!  Come back and see [place] in 1 year!

## 2017-07-20 NOTE — Assessment & Plan Note (Addendum)
Blood pressure remains well controlled on his chronic HCTZ 12.5mg  daily. He denies any weakness or lightheadedness. -BMET to assess renal function and lytes -Continue HCTZ 12.5mg  daily -56-month refill sent to pharmacy

## 2017-07-21 LAB — BMP8+ANION GAP
ANION GAP: 16 mmol/L (ref 10.0–18.0)
BUN / CREAT RATIO: 18 (ref 10–24)
BUN: 17 mg/dL (ref 8–27)
CO2: 26 mmol/L (ref 20–29)
CREATININE: 0.96 mg/dL (ref 0.76–1.27)
Calcium: 9.1 mg/dL (ref 8.6–10.2)
Chloride: 100 mmol/L (ref 96–106)
GFR, EST AFRICAN AMERICAN: 98 mL/min/{1.73_m2} (ref >59)
GFR, EST NON AFRICAN AMERICAN: 84 mL/min/{1.73_m2} (ref >59)
Glucose: 108 mg/dL — ABNORMAL HIGH (ref 65–99)
Potassium: 3.4 mmol/L — ABNORMAL LOW (ref 3.5–5.2)
SODIUM: 142 mmol/L (ref 134–144)

## 2017-07-21 LAB — TSH: TSH: 2.4 u[IU]/mL (ref 0.450–4.500)

## 2017-07-21 NOTE — Progress Notes (Signed)
Internal Medicine Clinic Attending  Case discussed with Dr. Molt at the time of the visit.  We reviewed the resident's history and exam and pertinent patient test results.  I agree with the assessment, diagnosis, and plan of care documented in the resident's note. 

## 2017-07-21 NOTE — Addendum Note (Signed)
Addended by: Lalla Brothers T on: 07/21/2017 08:47 AM   Modules accepted: Level of Service

## 2017-09-30 ENCOUNTER — Telehealth: Payer: Self-pay | Admitting: Internal Medicine

## 2018-03-14 ENCOUNTER — Encounter: Payer: Self-pay | Admitting: *Deleted

## 2018-10-16 ENCOUNTER — Encounter: Payer: Self-pay | Admitting: Internal Medicine

## 2018-10-16 DIAGNOSIS — Z8673 Personal history of transient ischemic attack (TIA), and cerebral infarction without residual deficits: Secondary | ICD-10-CM

## 2018-10-16 DIAGNOSIS — R0609 Other forms of dyspnea: Secondary | ICD-10-CM

## 2018-10-16 DIAGNOSIS — I10 Essential (primary) hypertension: Secondary | ICD-10-CM

## 2018-10-16 DIAGNOSIS — E039 Hypothyroidism, unspecified: Secondary | ICD-10-CM

## 2018-10-16 DIAGNOSIS — E785 Hyperlipidemia, unspecified: Secondary | ICD-10-CM

## 2018-10-19 MED ORDER — ASPIRIN EC 81 MG PO TBEC: 81.0000 mg | DELAYED_RELEASE_TABLET | Freq: Every day | ORAL | 3 refills | Status: DC

## 2018-10-19 MED ORDER — HYDROCHLOROTHIAZIDE 12.5 MG PO CAPS: 12.5000 mg | ORAL_CAPSULE | Freq: Every morning | ORAL | 3 refills | Status: DC

## 2018-10-19 MED ORDER — LEVOTHYROXINE SODIUM 100 MCG PO TABS: 100.0000 ug | ORAL_TABLET | Freq: Every day | ORAL | 3 refills | Status: DC

## 2018-10-19 MED ORDER — ATORVASTATIN CALCIUM 40 MG PO TABS: 40.0000 mg | ORAL_TABLET | Freq: Every day | ORAL | 3 refills | Status: DC

## 2018-10-19 NOTE — Telephone Encounter (Signed)
Discussed with Alec [name] on the phone about his questions on refills and upcoming appointment. Informed him of his June appointment and performed med rec via phone. Appropriate refills sent to his pharmacy at [facility].

## 2018-11-01 ENCOUNTER — Ambulatory Visit: Payer: Self-pay | Admitting: Internal Medicine

## 2018-11-01 ENCOUNTER — Encounter: Payer: Self-pay | Admitting: Internal Medicine

## 2018-11-01 ENCOUNTER — Other Ambulatory Visit: Payer: Self-pay

## 2018-11-01 VITALS — BP 128/78 | HR 73 | Temp 98.6°F | Wt 222.3 lb

## 2018-11-01 DIAGNOSIS — F329 Major depressive disorder, single episode, unspecified: Secondary | ICD-10-CM

## 2018-11-01 DIAGNOSIS — Z8673 Personal history of transient ischemic attack (TIA), and cerebral infarction without residual deficits: Secondary | ICD-10-CM

## 2018-11-01 DIAGNOSIS — Z0000 Encounter for general adult medical examination without abnormal findings: Secondary | ICD-10-CM

## 2018-11-01 DIAGNOSIS — I10 Essential (primary) hypertension: Secondary | ICD-10-CM

## 2018-11-01 DIAGNOSIS — Z79890 Hormone replacement therapy (postmenopausal): Secondary | ICD-10-CM

## 2018-11-01 DIAGNOSIS — Z87891 Personal history of nicotine dependence: Secondary | ICD-10-CM

## 2018-11-01 DIAGNOSIS — E785 Hyperlipidemia, unspecified: Secondary | ICD-10-CM

## 2018-11-01 DIAGNOSIS — Z7982 Long term (current) use of aspirin: Secondary | ICD-10-CM

## 2018-11-01 DIAGNOSIS — Z79899 Other long term (current) drug therapy: Secondary | ICD-10-CM

## 2018-11-01 DIAGNOSIS — E039 Hypothyroidism, unspecified: Secondary | ICD-10-CM
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
## 2018-11-01 NOTE — Assessment & Plan Note (Signed)
BP Readings from Last 3 Encounters:  11/01/18 128/78  07/20/17 134/80  08/28/16 139/79   BP above goal but acceptable. Current regimen include HCTZ 12.5mg  daily. Counseled on avoiding high salt content. Denies any chest pain, lower extremity edema.  -  BMP today - C/w HCTZ 12.5mg  daily

## 2018-11-01 NOTE — Assessment & Plan Note (Signed)
Discussed his lack of colon cancer screening in the past and his prior hx of refusing colon cancer screening. He states lack of health insurance and financial barriers as his primary factors. Described alternatives such as stool cards as well as risk and benefits. Alec [name] expressed understanding and states 'he would rather not know.'  - Agree to defer colon cancer screening per patient request.

## 2018-11-01 NOTE — Assessment & Plan Note (Signed)
Lab Results  Component Value Date   TSH 2.400 07/20/2017   Current regimen include Levothyroxine 171mcg daily. Last TSH 06/2017 appropriate. Denies any cold/heat intolerance, palpitations, weakness.  - TSH today - C/w 135mcg levothyroxine if TSH appropriate

## 2018-11-01 NOTE — Patient Instructions (Signed)
Thank you for allowing [place] to provide your care today. Today we discussed your high cholesterol  I have ordered bmp, TSH labs for you. I will call if any are abnormal.    Today we made no changes to your medications.    Please follow-up in 6 months.    Should you have any questions or concerns please call the internal medicine clinic at [phone].     Cholesterol  Cholesterol is a fat. Your body needs a small amount of cholesterol. Cholesterol (plaque) may build up in your blood vessels (arteries). That makes you more likely to have a heart attack or stroke. You cannot feel your cholesterol level. Having a blood test is the only way to find out if your level is high. Keep your test results. Work with your doctor to keep your cholesterol at a good level. What do the results mean?  Total cholesterol is how much cholesterol is in your blood.  LDL is bad cholesterol. This is the type that can build up. Try to have low LDL.  HDL is good cholesterol. It cleans your blood vessels and carries LDL away. Try to have high HDL.  Triglycerides are fat that the body can store or burn for energy. What are good levels of cholesterol?  Total cholesterol below 200.  LDL below 100 is good for people who have health risks. LDL below 70 is good for people who have very high risks.  HDL above 40 is good. It is best to have HDL of 60 or higher.  Triglycerides below 150. How can I lower my cholesterol? Diet Follow your diet program as told by your doctor.  Choose fish, white meat chicken, or [place] that is roasted or baked. Try not to eat red meat, fried foods, sausage, or lunch meats.  Eat lots of fresh fruits and vegetables.  Choose whole grains, beans, pasta, potatoes, and cereals.  Choose olive oil, corn oil, or canola oil. Only use small amounts.  Try not to eat butter, mayonnaise, shortening, or palm kernel oils.  Try not to eat foods with trans fats.  Choose low-fat or nonfat dairy  foods. ? Drink skim or nonfat milk. ? Eat low-fat or nonfat yogurt and cheeses. ? Try not to drink whole milk or cream. ? Try not to eat ice cream, egg yolks, or full-fat cheeses.  Healthy desserts include angel food cake, ginger snaps, animal crackers, hard candy, popsicles, and low-fat or nonfat frozen yogurt. Try not to eat pastries, cakes, pies, and cookies.  Exercise Follow your exercise program as told by your doctor.  Be more active. Try gardening, walking, and taking the stairs.  Ask your doctor about ways that you can be more active. Medicine  Take over-the-counter and prescription medicines only as told by your doctor. This information is not intended to replace advice given to you by your health care provider. Make sure you discuss any questions you have with your health care provider. Document Released: 08/07/2008 Document Revised: 12/11/2015 Document Reviewed: 11/21/2015 Elsevier Interactive Patient Education  2019 [employer].

## 2018-11-01 NOTE — Assessment & Plan Note (Signed)
Lipid Panel     Component Value Date/Time   CHOL 163 08/28/2016 1016   TRIG 139 08/28/2016 1016   HDL 45 08/28/2016 1016   CHOLHDL 3.6 08/28/2016 1016   CHOLHDL 4.3 06/12/2014 0932   VLDL 48 (H) 06/12/2014 0932   LDLCALC 90 08/28/2016 1016   Currently on high-intensity statin at atorvastatin 40mg  daily. Has prior smoking history and prior thalamic stroke on aspirin therapy. Calculated 10 year ASCVD risk of 9%. Discussed importance of avoiding high cholesterol diet. Denies any muscle pain.  - C/w current regimen: atorvastatin 40mg  daily

## 2018-11-02 LAB — BMP8+ANION GAP
Anion Gap: 20 mmol/L — ABNORMAL HIGH (ref 10.0–18.0)
BUN/Creatinine Ratio: 17 (ref 10–24)
BUN: 17 mg/dL (ref 8–27)
CO2: 23 mmol/L (ref 20–29)
Calcium: 9 mg/dL (ref 8.6–10.2)
Chloride: 103 mmol/L (ref 96–106)
Creatinine, Ser: 0.98 mg/dL (ref 0.76–1.27)
GFR calc Af Amer: 94 mL/min/{1.73_m2} (ref >59)
GFR calc non Af Amer: 82 mL/min/{1.73_m2} (ref >59)
Glucose: 93 mg/dL (ref 65–99)
Potassium: 3.7 mmol/L (ref 3.5–5.2)
Sodium: 146 mmol/L — ABNORMAL HIGH (ref 134–144)

## 2018-11-02 LAB — TSH: TSH: 1.63 u[IU]/mL (ref 0.450–4.500)

## 2018-11-02 NOTE — Addendum Note (Signed)
Addended by: Lalla Brothers T on: 11/02/2018 10:13 AM   Modules accepted: Level of Service

## 2018-11-02 NOTE — Progress Notes (Signed)
Internal Medicine Clinic Attending ° °Case discussed with Dr. Lee at the time of the visit.  We reviewed the resident’s history and exam and pertinent patient test results.  I agree with the assessment, diagnosis, and plan of care documented in the resident’s note.  °

## 2018-11-07 ENCOUNTER — Telehealth: Payer: Self-pay | Admitting: Internal Medicine

## 2018-11-07 NOTE — Telephone Encounter (Signed)
Discussed with Alec [name] regarding his normal TSH and BMP. He was able to confirm picking up his medications without difficulty. No other concerns at the moment.

## 2019-05-30 ENCOUNTER — Other Ambulatory Visit: Payer: Self-pay

## 2019-05-30 ENCOUNTER — Ambulatory Visit: Payer: Self-pay | Admitting: Internal Medicine

## 2019-05-30 ENCOUNTER — Encounter: Payer: Self-pay | Admitting: Internal Medicine

## 2019-05-30 VITALS — BP 130/60 | HR 64 | Temp 98.5°F | Ht 68.0 in | Wt 228.1 lb

## 2019-05-30 DIAGNOSIS — E785 Hyperlipidemia, unspecified: Secondary | ICD-10-CM

## 2019-05-30 DIAGNOSIS — F329 Major depressive disorder, single episode, unspecified: Secondary | ICD-10-CM

## 2019-05-30 DIAGNOSIS — E039 Hypothyroidism, unspecified: Secondary | ICD-10-CM

## 2019-05-30 DIAGNOSIS — Z79890 Hormone replacement therapy (postmenopausal): Secondary | ICD-10-CM

## 2019-05-30 DIAGNOSIS — F32A Depression, unspecified: Secondary | ICD-10-CM

## 2019-05-30 DIAGNOSIS — Z8673 Personal history of transient ischemic attack (TIA), and cerebral infarction without residual deficits: Secondary | ICD-10-CM

## 2019-05-30 DIAGNOSIS — Z79899 Other long term (current) drug therapy: Secondary | ICD-10-CM

## 2019-05-30 DIAGNOSIS — I10 Essential (primary) hypertension: Secondary | ICD-10-CM

## 2019-05-30 DIAGNOSIS — Z7982 Long term (current) use of aspirin: Secondary | ICD-10-CM

## 2019-05-30 MED ORDER — AMLODIPINE BESYLATE 5 MG PO TABS: 5.0000 mg | ORAL_TABLET | Freq: Every day | ORAL | 0 refills | Status: DC

## 2019-05-30 NOTE — Assessment & Plan Note (Addendum)
Presents w/ complaints of heat intolerance. Had prior hx of hypothyroidism diagnosed in 2004 when he was admitted at [facility] for stroke. Mentions not having had any further work-up for why he has low thyroid due to lack of insurance. Currently on levothyroxine 128mcg daily. Last TSH wnl. Mentions that he takes his levothyroxine as prescribed. Described common symptoms of hyperthyroidism including weight loss, heat intolerance, palpitations. Denies other symptoms besides heat intolerance. Suspect possible iatrogenic hyperthyroidism. Will check TSH and adjust dose as appropriate.  - TSH - C/w 150mcg if TSH with normal limits  - TSH elevated at 6.08. Will discuss with patient about adjusting his levothyroxine dosing to 112 mcg

## 2019-05-30 NOTE — Progress Notes (Signed)
CC: Hot Flashes  HPI: [name] is a 64 y.o. M w/ PMH of HTN, HLD, CVA, Hypothyroidism who presents for management of his chronic conditions. He mentions that he has been having 'hot flashes' and what is described as heat intolerance when he works outside. He mentions that this has been ongoing for years without any obvious inciting events. He states that this has been ongoing for years but states particularly this last winter he had worsening symptoms. He believes this may be a side effect of his blood pressure medication. Mentions that in the past he had attributed it to his psych meds but his psych meds have been changed without significant change in his symptoms. He mentions he stopped taking his HCTZ for about a week now. Mentions that he has not yet noticed any reoccurrence of his heat intolerance. Denies any sick contact, malaise, cough, dyspnea, nausea, vomiting. Mentions he takes his thyroid medications as prescribed. Denies any palpitations, chest pain, weight loss.  Past Medical History:  Diagnosis Date  . Depression    sees psychiatrist at behavioral health  . Hyperlipidemia   . Hypertension   . Hypothyroidism   . Stroke [facility]) 2004   acute thalamic infarct, started on aspirin at that time   Review of Systems: Review of Systems  Constitutional: Negative for chills, fever and malaise/fatigue.  Eyes: Negative for blurred vision.  Respiratory: Negative for shortness of breath.   Cardiovascular: Negative for chest pain and palpitations.  Gastrointestinal: Negative for constipation, diarrhea, nausea and vomiting.  Neurological: Negative for dizziness and headaches.     Physical Exam: Vitals:   05/30/19 1316  BP: 130/60  Pulse: 64  Temp: 98.5 F (36.9 C)  TempSrc: Oral  SpO2: 98%  Weight: 228 lb 1.6 oz (103.5 kg)  Height: 5\' 8"  (1.727 m)   Physical Exam  Constitutional: He is oriented to person, place, and time. He appears well-developed and well-nourished. No  distress.  HENT:  Mouth/Throat: Oropharynx is clear and moist.  Eyes: Conjunctivae are normal.  Neck: No thyromegaly present.  Cardiovascular: Normal rate, regular rhythm, normal heart sounds and intact distal pulses.  No murmur heard. Respiratory: Effort normal and breath sounds normal. He has no wheezes. He has no rales.  GI: Soft. Bowel sounds are normal. He exhibits no distension. There is no abdominal tenderness.  Musculoskeletal:        General: Edema (Trace ankle edema) present. Normal range of motion.     Cervical back: Normal range of motion and neck supple.  Neurological: He is alert and oriented to person, place, and time.  Skin: Skin is warm and dry.     Assessment & Plan:   Essential hypertension BP Readings from Last 3 Encounters:  05/30/19 130/60  11/01/18 128/78  07/20/17 134/80   Blood pressure slightly above goal despite not taking his HCTZ for the last 7 days.. Mentions change in dietary habits. Have avoided [employer] and [place] for last 6 months. Requesting to find alternative therapy as he believes HCTZ is causing side effects. Discussed low likelihood of his symptoms rising from HCTZ but his bp appears to be well controlled despite no therapy. Discussed option of discontinuing anti-hypertensive regimen altogether vs starting alternative therapies. Recommended starting amlodipine as alternative therapy as he had prior hx of stroke. Mr.Tersigni expressed understanding.  - Start amlodipine 5mg  daily - C/w DASH diet  Hypothyroidism Presents w/ complaints of heat intolerance. Had prior hx of hypothyroidism diagnosed in 2004 when he was  admitted at [facility] for stroke. Mentions not having had any further work-up for why he has low thyroid due to lack of insurance. Currently on levothyroxine 167mcg daily. Last TSH wnl. Mentions that he takes his levothyroxine as prescribed. Described common symptoms of hyperthyroidism including weight loss, heat intolerance, palpitations.  Denies other symptoms besides heat intolerance. Suspect possible iatrogenic hyperthyroidism. Will check TSH and adjust dose as appropriate.  - TSH - C/w 134mcg if TSH with normal limits  Depression Follows with psychiatry outpatient. Mentions that he has stopped taking Topamax and is only taking Prozac. Denies any significant side effects. PHQ-9 performed in office today score of 8 consistent w/ mild depression. Denies any suicidal/homicidal ideations.  - F/u with psych  History of CVA (cerebrovascular accident) without residual deficits CVA diagnosed in 2004. Acute ischemic right thalamic infarct. No residual deficits. Currently on asa 81mg  and atorvastatin 40mg .   - C/w asa, statin    Patient discussed with Dr. Philipp Ovens   -Gilberto Better, PGY2 [facility] Pager: [phone]

## 2019-05-30 NOTE — Patient Instructions (Signed)
Dear Alec [name] Alec [name],  Thank you for allowing [place] to provide your care today. Today we discussed your     I have ordered bmp, tsh labs for you. I will call if any are abnormal.    Today we made the following changes to your medications:    Please replace your hydrochlorothiazide with amlodipine 5mg  daily  Please follow-up in 3 months.    Should you have any questions or concerns please call the internal medicine clinic at [phone].    Thank you for choosing [facility].  Amlodipine Oral Tablets What is this medicine? AMLODIPINE (am LOE di peen) is a calcium channel blocker. It relaxes your blood vessels and decreases the amount of work the heart has to do. It treats high blood pressure and/or prevents chest pain (also called angina). This medicine may be used for other purposes; ask your health care provider or pharmacist if you have questions. COMMON BRAND NAME(S): Norvasc What should I tell my health care provider before I take this medicine? They need to know if you have any of these conditions:  heart disease  liver disease  an unusual or allergic reaction to amlodipine, other drugs, foods, dyes, or preservatives  pregnant or trying to get pregnant  breast-feeding How should I use this medicine? Take this drug by mouth. Take it as directed on the prescription label at the same time every day. You can take it with or without food. If it upsets your stomach, take it with food. Keep taking it unless your health care provider tells you to stop. Talk to your health care provider about the use of this drug in children. While it may be prescribed for children as young as 6 for selected conditions, precautions do apply. Overdosage: If you think you have taken too much of this medicine contact a poison control center or emergency room at once. NOTE: This medicine is only for you. Do not share this medicine with others. What if I miss a dose? If you miss a dose, take it as soon  as you can. If it is almost time for your next dose, take only that dose. Do not take double or extra doses. What may interact with this medicine? This medicine may interact with the following medications:  clarithromycin  cyclosporine  diltiazem  itraconazole  simvastatin  tacrolimus This list may not describe all possible interactions. Give your health care provider a list of all the medicines, herbs, non-prescription drugs, or dietary supplements you use. Also tell them if you smoke, drink alcohol, or use illegal drugs. Some items may interact with your medicine. What should I watch for while using this medicine? Visit your health care provider for regular checks on your progress. Check your blood pressure as directed. Ask your health care provider what your blood pressure should be. Also, find out when you should contact him or her. Do not treat yourself for coughs, colds, or pain while you are using this drug without asking your health care provider for advice. Some drugs may increase your blood pressure. You may get drowsy or dizzy. Do not drive, use machinery, or do anything that needs mental alertness until you know how this drug affects you. Do not stand up or sit up quickly, especially if you are an older patient. This reduces the risk of dizzy or fainting spells. What side effects may I notice from receiving this medicine? Side effects that you should report to your doctor or health care provider as soon  as possible:  allergic reactions (skin rash, itching or hives; swelling of the face, lips, or tongue)  heart attack (trouble breathing; pain or tightness in the chest, neck, back or arms; unusually weak or tired)  low blood pressure (dizziness; feeling faint or lightheaded, falls; unusually weak or tired) Side effects that usually do not require medical attention (report these to your doctor or health care provider if they continue or are bothersome):  facial  flushing  nausea  palpitations  stomach pain  sudden weight gain  swelling of the ankles, feet, hands This list may not describe all possible side effects. Call your doctor for medical advice about side effects. You may report side effects to FDA at 1-800-FDA-1088. Where should I keep my medicine? Keep out of the reach of children and pets. Store at room temperature between 59 and 86 degrees F (15 and 30 degrees C). Protect from light and moisture. Keep the container tightly closed. Throw away any unused drug after the expiration date. NOTE: This sheet is a summary. It may not cover all possible information. If you have questions about this medicine, talk to your doctor, pharmacist, or health care provider.  2020 Elsevier/Gold Standard (2019-02-14 19:39:45)

## 2019-05-30 NOTE — Assessment & Plan Note (Signed)
BP Readings from Last 3 Encounters:  05/30/19 130/60  11/01/18 128/78  07/20/17 134/80   Blood pressure slightly above goal despite not taking his HCTZ for the last 7 days.. Mentions change in dietary habits. Have avoided [employer] and [place] for last 6 months. Requesting to find alternative therapy as he believes HCTZ is causing side effects. Discussed low likelihood of his symptoms rising from HCTZ but his bp appears to be well controlled despite no therapy. Discussed option of discontinuing anti-hypertensive regimen altogether vs starting alternative therapies. Recommended starting amlodipine as alternative therapy as he had prior hx of stroke. Alec [name] expressed understanding.  - Start amlodipine 5mg  daily - C/w DASH diet

## 2019-05-30 NOTE — Assessment & Plan Note (Signed)
CVA diagnosed in 2004. Acute ischemic right thalamic infarct. No residual deficits. Currently on asa 81mg  and atorvastatin 40mg .   - C/w asa, statin

## 2019-05-30 NOTE — Assessment & Plan Note (Addendum)
Follows with psychiatry outpatient. Mentions that he has stopped taking Topamax and is only taking Prozac. Denies any significant side effects. PHQ-9 performed in office today score of 8 consistent w/ mild depression. Denies any suicidal/homicidal ideations.  - F/u with psych

## 2019-05-31 ENCOUNTER — Telehealth: Payer: Self-pay | Admitting: Internal Medicine

## 2019-05-31 DIAGNOSIS — E039 Hypothyroidism, unspecified: Secondary | ICD-10-CM

## 2019-05-31 LAB — BMP8+ANION GAP
Anion Gap: 13 mmol/L (ref 10.0–18.0)
BUN/Creatinine Ratio: 20 (ref 10–24)
BUN: 14 mg/dL (ref 8–27)
CO2: 25 mmol/L (ref 20–29)
Calcium: 8.7 mg/dL (ref 8.6–10.2)
Chloride: 101 mmol/L (ref 96–106)
Creatinine, Ser: 0.7 mg/dL — ABNORMAL LOW (ref 0.76–1.27)
GFR calc Af Amer: 115 mL/min/{1.73_m2} (ref >59)
GFR calc non Af Amer: 100 mL/min/{1.73_m2} (ref >59)
Glucose: 90 mg/dL (ref 65–99)
Potassium: 4.5 mmol/L (ref 3.5–5.2)
Sodium: 139 mmol/L (ref 134–144)

## 2019-05-31 LAB — TSH: TSH: 6.08 u[IU]/mL — ABNORMAL HIGH (ref 0.450–4.500)

## 2019-05-31 MED ORDER — LEVOTHYROXINE SODIUM 112 MCG PO TABS: 112.0000 ug | ORAL_TABLET | Freq: Every day | ORAL | 0 refills | Status: DC

## 2019-05-31 NOTE — Telephone Encounter (Signed)
Called and spoke with Alec [name] regarding his TSH level and need for increasing his levothyroxine dose. Discussed whether he missed any of his doses prior to coming to clinic yesterday and he stated that he did not miss any doses. New script for levothyroxine 177mcg sent to pharmacy.

## 2019-06-01 NOTE — Progress Notes (Signed)
Internal Medicine Clinic Attending ° °Case discussed with Dr. Lee at the time of the visit.  We reviewed the resident’s history and exam and pertinent patient test results.  I agree with the assessment, diagnosis, and plan of care documented in the resident’s note.  °

## 2019-08-17 ENCOUNTER — Ambulatory Visit: Payer: Self-pay | Attending: Internal Medicine

## 2019-08-17 DIAGNOSIS — Z23 Encounter for immunization: Secondary | ICD-10-CM

## 2019-08-17 NOTE — Progress Notes (Signed)
   Covid-19 Vaccination Clinic  Name:  Alec [name]    MRN: [id number] DOB: [birth year]  08/17/2019  [name] was observed post Covid-19 immunization for 15 minutes without incident. He was provided with Vaccine Information Sheet and instruction to access the V-Safe system.   [name] was instructed to call 911 with any severe reactions post vaccine: Marland Kitchen Difficulty breathing  . Swelling of face and throat  . A fast heartbeat  . A bad rash all over body  . Dizziness and weakness   Immunizations Administered    Name Date Dose VIS Date Route   Pfizer COVID-19 Vaccine 08/17/2019  2:07 PM 0.3 mL 05/05/2019 Intramuscular   Manufacturer: [place]   Lot: [id number]   [place]: [id number]

## 2019-08-23 ENCOUNTER — Other Ambulatory Visit: Payer: Self-pay | Admitting: Internal Medicine

## 2019-08-23 DIAGNOSIS — I10 Essential (primary) hypertension: Secondary | ICD-10-CM

## 2019-08-23 DIAGNOSIS — E039 Hypothyroidism, unspecified: Secondary | ICD-10-CM

## 2019-08-28 ENCOUNTER — Ambulatory Visit: Payer: Self-pay | Admitting: Internal Medicine

## 2019-08-28 ENCOUNTER — Encounter: Payer: Self-pay | Admitting: Internal Medicine

## 2019-08-28 VITALS — BP 116/68 | HR 66 | Temp 98.2°F | Ht 68.0 in | Wt 229.9 lb

## 2019-08-28 DIAGNOSIS — E039 Hypothyroidism, unspecified: Secondary | ICD-10-CM

## 2019-08-28 DIAGNOSIS — M7989 Other specified soft tissue disorders: Secondary | ICD-10-CM

## 2019-08-28 DIAGNOSIS — F32A Depression, unspecified: Secondary | ICD-10-CM

## 2019-08-28 DIAGNOSIS — E785 Hyperlipidemia, unspecified: Secondary | ICD-10-CM

## 2019-08-28 DIAGNOSIS — Z79890 Hormone replacement therapy (postmenopausal): Secondary | ICD-10-CM

## 2019-08-28 DIAGNOSIS — Z79899 Other long term (current) drug therapy: Secondary | ICD-10-CM

## 2019-08-28 DIAGNOSIS — I10 Essential (primary) hypertension: Secondary | ICD-10-CM

## 2019-08-28 DIAGNOSIS — F329 Major depressive disorder, single episode, unspecified: Secondary | ICD-10-CM

## 2019-08-28 DIAGNOSIS — Z8673 Personal history of transient ischemic attack (TIA), and cerebral infarction without residual deficits: Secondary | ICD-10-CM

## 2019-08-28 MED ORDER — AMLODIPINE BESYLATE 5 MG PO TABS: 5.0000 mg | ORAL_TABLET | Freq: Every day | ORAL | 3 refills | Status: AC

## 2019-08-28 MED ORDER — HYDROCHLOROTHIAZIDE 25 MG PO TABS: 25.0000 mg | ORAL_TABLET | Freq: Every day | ORAL | 3 refills | Status: AC

## 2019-08-28 MED ORDER — ATORVASTATIN CALCIUM 40 MG PO TABS: 40.0000 mg | ORAL_TABLET | Freq: Every day | ORAL | 3 refills | Status: AC

## 2019-08-28 NOTE — Assessment & Plan Note (Signed)
Noted to have elevated score of 18 on PHQ-9. Elevated from score of 9 on prior visit. Currently on fluoxetine 20mg  daily and follows with psychiatry. Sees therapist every day. He mentions depressed mood recently worsened by poor weather and social isolation due to [place] pandemic. Had prolonged discussion regarding his lack of social support and anxiety regarding his mother's health. After our conversation, he states that he is due to get 2nd COVID vaccine in few weeks and discussed with Mr.Eisenhart in expanding his social circle once the pandemic precautions are over. Denies any suicidal/homicidal ideations or hallucinations.  - C/w fluoxetine 60mg  daily - F/u with psych

## 2019-08-28 NOTE — Progress Notes (Signed)
CC: Hypothyroidism  HPI: [name] is a 64 y.o. with PMH listed below presenting for management of his hypothyroidism. Please see problem based assessment and plan for further details.  Past Medical History:  Diagnosis Date  . Depression    sees psychiatrist at behavioral health  . Hyperlipidemia   . Hypertension   . Hypothyroidism   . Stroke [facility]) 2004   acute thalamic infarct, started on aspirin at that time    Review of Systems: Review of Systems  Constitutional: Negative for chills, fever and malaise/fatigue.  Respiratory: Negative for shortness of breath.   Cardiovascular: Positive for leg swelling. Negative for chest pain and palpitations.  Gastrointestinal: Negative for constipation, diarrhea, nausea and vomiting.  Psychiatric/Behavioral: Positive for depression. Negative for substance abuse and suicidal ideas. The patient is not nervous/anxious and does not have insomnia.      Physical Exam: Vitals:   08/28/19 1350 08/28/19 1400  BP: 132/64 116/68  Pulse: 66   Temp: 98.2 F (36.8 C)   TempSrc: Oral   SpO2: 100%   Weight: 229 lb 14.4 oz (104.3 kg)   Height: 5\' 8"  (1.727 m)     Physical Exam  Constitutional: He is oriented to person, place, and time. He appears well-developed and well-nourished. No distress.  Eyes: Conjunctivae and EOM are normal.  Cardiovascular: Normal rate, regular rhythm, normal heart sounds and intact distal pulses.  No murmur heard. Respiratory: Effort normal and breath sounds normal. He has no wheezes. He has no rales.  GI: Soft. Bowel sounds are normal. He exhibits no distension. There is no abdominal tenderness.  Musculoskeletal:        General: No edema. Normal range of motion.  Neurological: He is alert and oriented to person, place, and time.  Skin: Skin is warm and dry.  Psychiatric: He has a normal mood and affect.      Assessment & Plan:   Essential hypertension BP Readings from Last 3 Encounters:  08/28/19  116/68  05/30/19 130/60  11/01/18 128/78   Blood pressure at goal this am. He mentions that despite him stating he feels HCTZ causes too much side effects, he have continued to take the HCTZ in addition to his amlodipine. He is tolerating this current anti-hypertensive regimen well without significant side effects.  - C/w amlodipine 5 mg daily, HCTZ 25mg  daily - C/w DASH diet  Hypothyroidism [name] presents for follow up care for his hypothyroidism. He was able to pick up his 112 mcg dose and has been tolerating it well without significant side effects. He denies any significant heat/cold intolerance, skin changes or palpitations. Discussed proper dosing of 113mcg and he confirms timing of his meds at least 30 minutes before meals.   - TSH - C/w 112 mcg levothyroxine  Hyperlipidemia Lipid Panel     Component Value Date/Time   CHOL 163 08/28/2016 1016   TRIG 139 08/28/2016 1016   HDL 45 08/28/2016 1016   CHOLHDL 3.6 08/28/2016 1016   CHOLHDL 4.3 06/12/2014 0932   VLDL 48 (H) 06/12/2014 0932   LDLCALC 90 08/28/2016 1016   LABVLDL 28 08/28/2016 1016   Continuing to take high-intensity statin at atorvastatin 40mg  daily after CVA. Currently denies any significant side effects such as muscle pain.  - C/w atorvastatin 40mg  daily  Depression Noted to have elevated score of 18 on PHQ-9. Elevated from score of 9 on prior visit. Currently on fluoxetine 20mg  daily and follows with psychiatry. Sees therapist every day. He mentions depressed  mood recently worsened by poor weather and social isolation due to [place] pandemic. Had prolonged discussion regarding his lack of social support and anxiety regarding his mother's health. After our conversation, he states that he is due to get 2nd COVID vaccine in few weeks and discussed with [name] in expanding his social circle once the pandemic precautions are over. Denies any suicidal/homicidal ideations or hallucinations.  - C/w fluoxetine 60mg   daily - F/u with psych    Patient discussed with Dr. Angelia Mould   -Gilberto Better, PGY2 [facility] Pager: [phone]

## 2019-08-28 NOTE — Assessment & Plan Note (Signed)
Lipid Panel     Component Value Date/Time   CHOL 163 08/28/2016 1016   TRIG 139 08/28/2016 1016   HDL 45 08/28/2016 1016   CHOLHDL 3.6 08/28/2016 1016   CHOLHDL 4.3 06/12/2014 0932   VLDL 48 (H) 06/12/2014 0932   LDLCALC 90 08/28/2016 1016   LABVLDL 28 08/28/2016 1016   Continuing to take high-intensity statin at atorvastatin 40mg  daily after CVA. Currently denies any significant side effects such as muscle pain.  - C/w atorvastatin 40mg  daily

## 2019-08-28 NOTE — Assessment & Plan Note (Signed)
BP Readings from Last 3 Encounters:  08/28/19 116/68  05/30/19 130/60  11/01/18 128/78   Blood pressure at goal this am. He mentions that despite him stating he feels HCTZ causes too much side effects, he have continued to take the HCTZ in addition to his amlodipine. He is tolerating this current anti-hypertensive regimen well without significant side effects.  - C/w amlodipine 5 mg daily, HCTZ 25mg  daily - C/w DASH diet

## 2019-08-28 NOTE — Patient Instructions (Addendum)
Dear [name],  Thank you for allowing [place] to provide your care today. Today we discussed your thyroid disease    I have ordered TSH labs for you. I will call if any are abnormal.    Today we made no changes to your medications:    Please continue to take your home medications as prescribed  Please follow-up in 6 months.    Should you have any questions or concerns please call the internal medicine clinic at [phone].    Thank you for choosing [facility].   Hypothyroidism  Hypothyroidism is when the thyroid gland does not make enough of certain hormones (it is underactive). The thyroid gland is a small gland located in the lower front part of the neck, just in front of the windpipe (trachea). This gland makes hormones that help control how the body uses food for energy (metabolism) as well as how the heart and brain function. These hormones also play a role in keeping your bones strong. When the thyroid is underactive, it produces too little of the hormones thyroxine (T4) and triiodothyronine (T3). What are the causes? This condition may be caused by:  Hashimoto's disease. This is a disease in which the body's disease-fighting system (immune system) attacks the thyroid gland. This is the most common cause.  Viral infections.  Pregnancy.  Certain medicines.  Birth defects.  Past radiation treatments to the head or neck for cancer.  Past treatment with radioactive iodine.  Past exposure to radiation in the environment.  Past surgical removal of part or all of the thyroid.  Problems with a gland in the center of the brain (pituitary gland).  Lack of enough iodine in the diet. What increases the risk? You are more likely to develop this condition if:  You are male.  You have a family history of thyroid conditions.  You use a medicine called lithium.  You take medicines that affect the immune system (immunosuppressants). What are the signs or  symptoms? Symptoms of this condition include:  Feeling as though you have no energy (lethargy).  Not being able to tolerate cold.  Weight gain that is not explained by a change in diet or exercise habits.  Lack of appetite.  Dry skin.  Coarse hair.  Menstrual irregularity.  Slowing of thought processes.  Constipation.  Sadness or depression. How is this diagnosed? This condition may be diagnosed based on:  Your symptoms, your medical history, and a physical exam.  Blood tests. You may also have imaging tests, such as an ultrasound or MRI. How is this treated? This condition is treated with medicine that replaces the thyroid hormones that your body does not make. After you begin treatment, it may take several weeks for symptoms to go away. Follow these instructions at home:  Take over-the-counter and prescription medicines only as told by your health care provider.  If you start taking any new medicines, tell your health care provider.  Keep all follow-up visits as told by your health care provider. This is important. ? As your condition improves, your dosage of thyroid hormone medicine may change. ? You will need to have blood tests regularly so that your health care provider can monitor your condition. Contact a health care provider if:  Your symptoms do not get better with treatment.  You are taking thyroid replacement medicine and you: ? Sweat a lot. ? Have tremors. ? Feel anxious. ? Lose weight rapidly. ? Cannot tolerate heat. ? Have emotional swings. ? Have  diarrhea. ? Feel weak. Get help right away if you have:  Chest pain.  An irregular heartbeat.  A rapid heartbeat.  Difficulty breathing. Summary  Hypothyroidism is when the thyroid gland does not make enough of certain hormones (it is underactive).  When the thyroid is underactive, it produces too little of the hormones thyroxine (T4) and triiodothyronine (T3).  The most common cause is  Hashimoto's disease, a disease in which the body's disease-fighting system (immune system) attacks the thyroid gland. The condition can also be caused by viral infections, medicine, pregnancy, or past radiation treatment to the head or neck.  Symptoms may include weight gain, dry skin, constipation, feeling as though you do not have energy, and not being able to tolerate cold.  This condition is treated with medicine to replace the thyroid hormones that your body does not make. This information is not intended to replace advice given to you by your health care provider. Make sure you discuss any questions you have with your health care provider. Document Revised: 04/23/2017 Document Reviewed: 04/21/2017 Elsevier Patient Education  2020 [employer].

## 2019-08-28 NOTE — Assessment & Plan Note (Signed)
Mr.Kogler presents for follow up care for his hypothyroidism. He was able to pick up his 112 mcg dose and has been tolerating it well without significant side effects. He denies any significant heat/cold intolerance, skin changes or palpitations. Discussed proper dosing of 181mcg and he confirms timing of his meds at least 30 minutes before meals.   - TSH - C/w 112 mcg levothyroxine

## 2019-08-29 ENCOUNTER — Encounter: Payer: Self-pay | Admitting: Internal Medicine

## 2019-08-29 LAB — TSH: TSH: 1.14 u[IU]/mL (ref 0.450–4.500)

## 2019-08-30 NOTE — Progress Notes (Signed)
Internal Medicine Clinic Attending ° °Case discussed with Dr. Lee at the time of the visit.  We reviewed the resident’s history and exam and pertinent patient test results.  I agree with the assessment, diagnosis, and plan of care documented in the resident’s note.  °

## 2019-09-11 ENCOUNTER — Ambulatory Visit: Payer: Self-pay | Attending: Internal Medicine

## 2019-09-11 DIAGNOSIS — Z23 Encounter for immunization: Secondary | ICD-10-CM

## 2019-09-11 NOTE — Progress Notes (Signed)
   Covid-19 Vaccination Clinic  Name:  Alec [name]    MRN: [id number] DOB: 1956/03/11  09/11/2019  Alec [name] was observed post Covid-19 immunization for 15 minutes without incident. He was provided with Vaccine Information Sheet and instruction to access the V-Safe system.   Alec [name] was instructed to call 911 with any severe reactions post vaccine: Marland Kitchen Difficulty breathing  . Swelling of face and throat  . A fast heartbeat  . A bad rash all over body  . Dizziness and weakness   Immunizations Administered    Name Date Dose VIS Date Route   Pfizer COVID-19 Vaccine 09/11/2019 11:00 AM 0.3 mL 07/19/2018 Intramuscular   Manufacturer: [place]   Lot: [id number]   [place]: [id number]

## 2019-12-01 DIAGNOSIS — I10 Essential (primary) hypertension: Secondary | ICD-10-CM | POA: Diagnosis not present

## 2019-12-01 DIAGNOSIS — E039 Hypothyroidism, unspecified: Secondary | ICD-10-CM | POA: Diagnosis not present

## 2019-12-01 DIAGNOSIS — R251 Tremor, unspecified: Secondary | ICD-10-CM | POA: Diagnosis not present

## 2019-12-01 DIAGNOSIS — R5383 Other fatigue: Secondary | ICD-10-CM | POA: Diagnosis not present

## 2019-12-04 DIAGNOSIS — H5203 Hypermetropia, bilateral: Secondary | ICD-10-CM | POA: Diagnosis not present

## 2019-12-04 DIAGNOSIS — H52203 Unspecified astigmatism, bilateral: Secondary | ICD-10-CM | POA: Diagnosis not present

## 2019-12-04 DIAGNOSIS — H2513 Age-related nuclear cataract, bilateral: Secondary | ICD-10-CM | POA: Diagnosis not present

## 2019-12-26 ENCOUNTER — Other Ambulatory Visit: Payer: Self-pay | Admitting: Internal Medicine

## 2019-12-26 DIAGNOSIS — E039 Hypothyroidism, unspecified: Secondary | ICD-10-CM

## 2019-12-28 ENCOUNTER — Ambulatory Visit (INDEPENDENT_AMBULATORY_CARE_PROVIDER_SITE_OTHER): Payer: Medicare Other | Admitting: Psychiatry

## 2019-12-28 ENCOUNTER — Other Ambulatory Visit: Payer: Self-pay

## 2019-12-28 ENCOUNTER — Encounter (HOSPITAL_COMMUNITY): Payer: Self-pay | Admitting: Psychiatry

## 2019-12-28 DIAGNOSIS — F4310 Post-traumatic stress disorder, unspecified: Secondary | ICD-10-CM

## 2019-12-28 DIAGNOSIS — F419 Anxiety disorder, unspecified: Secondary | ICD-10-CM | POA: Insufficient documentation

## 2019-12-28 DIAGNOSIS — F330 Major depressive disorder, recurrent, mild: Secondary | ICD-10-CM

## 2019-12-28 MED ORDER — FLUOXETINE HCL 20 MG PO CAPS: 20.0000 mg | ORAL_CAPSULE | Freq: Every day | ORAL | 2 refills | Status: DC

## 2019-12-28 MED ORDER — FLUOXETINE HCL 60 MG PO TABS: 60.0000 mg | ORAL_TABLET | Freq: Every day | ORAL | 2 refills | Status: DC
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
## 2020-03-28 ENCOUNTER — Encounter (HOSPITAL_COMMUNITY): Payer: Medicare Other | Admitting: Psychiatry

## 2020-04-21 DIAGNOSIS — Z03818 Encounter for observation for suspected exposure to other biological agents ruled out: Secondary | ICD-10-CM | POA: Diagnosis not present

## 2020-06-13 DIAGNOSIS — R208 Other disturbances of skin sensation: Secondary | ICD-10-CM | POA: Diagnosis not present

## 2020-06-13 DIAGNOSIS — Z23 Encounter for immunization: Secondary | ICD-10-CM | POA: Diagnosis not present

## 2020-06-13 DIAGNOSIS — I10 Essential (primary) hypertension: Secondary | ICD-10-CM | POA: Diagnosis not present

## 2020-06-13 DIAGNOSIS — Z0000 Encounter for general adult medical examination without abnormal findings: Secondary | ICD-10-CM | POA: Diagnosis not present

## 2020-06-13 DIAGNOSIS — Z1159 Encounter for screening for other viral diseases: Secondary | ICD-10-CM | POA: Diagnosis not present

## 2020-06-13 DIAGNOSIS — E7800 Pure hypercholesterolemia, unspecified: Secondary | ICD-10-CM | POA: Diagnosis not present

## 2020-06-13 DIAGNOSIS — Z1211 Encounter for screening for malignant neoplasm of colon: Secondary | ICD-10-CM | POA: Diagnosis not present

## 2020-09-02 ENCOUNTER — Other Ambulatory Visit: Payer: Self-pay | Admitting: Student

## 2020-09-02 ENCOUNTER — Other Ambulatory Visit: Payer: Self-pay | Admitting: Internal Medicine

## 2020-09-02 DIAGNOSIS — I10 Essential (primary) hypertension: Secondary | ICD-10-CM

## 2020-09-02 DIAGNOSIS — E039 Hypothyroidism, unspecified: Secondary | ICD-10-CM

## 2020-09-02 DIAGNOSIS — E785 Hyperlipidemia, unspecified: Secondary | ICD-10-CM

## 2020-09-05 ENCOUNTER — Other Ambulatory Visit: Payer: Self-pay | Admitting: Student

## 2020-09-05 DIAGNOSIS — E039 Hypothyroidism, unspecified: Secondary | ICD-10-CM

## 2020-11-04 DIAGNOSIS — Z23 Encounter for immunization: Secondary | ICD-10-CM | POA: Diagnosis not present

## 2020-12-04 DIAGNOSIS — H2513 Age-related nuclear cataract, bilateral: Secondary | ICD-10-CM | POA: Diagnosis not present

## 2020-12-04 DIAGNOSIS — H5203 Hypermetropia, bilateral: Secondary | ICD-10-CM | POA: Diagnosis not present

## 2020-12-04 DIAGNOSIS — H52203 Unspecified astigmatism, bilateral: Secondary | ICD-10-CM | POA: Diagnosis not present

## 2020-12-09 DIAGNOSIS — I10 Essential (primary) hypertension: Secondary | ICD-10-CM | POA: Diagnosis not present

## 2020-12-09 DIAGNOSIS — R609 Edema, unspecified: Secondary | ICD-10-CM | POA: Diagnosis not present

## 2020-12-09 DIAGNOSIS — Z23 Encounter for immunization: Secondary | ICD-10-CM | POA: Diagnosis not present

## 2020-12-09 DIAGNOSIS — R251 Tremor, unspecified: Secondary | ICD-10-CM | POA: Diagnosis not present

## 2020-12-09 DIAGNOSIS — E7800 Pure hypercholesterolemia, unspecified: Secondary | ICD-10-CM | POA: Diagnosis not present

## 2020-12-09 DIAGNOSIS — E039 Hypothyroidism, unspecified: Secondary | ICD-10-CM | POA: Diagnosis not present

## 2020-12-25 DIAGNOSIS — K5140 Inflammatory polyps of colon without complications: Secondary | ICD-10-CM | POA: Diagnosis not present

## 2020-12-25 DIAGNOSIS — Z1211 Encounter for screening for malignant neoplasm of colon: Secondary | ICD-10-CM | POA: Diagnosis not present

## 2020-12-25 DIAGNOSIS — D124 Benign neoplasm of descending colon: Secondary | ICD-10-CM | POA: Diagnosis not present

## 2020-12-25 DIAGNOSIS — K5730 Diverticulosis of large intestine without perforation or abscess without bleeding: Secondary | ICD-10-CM | POA: Diagnosis not present

## 2020-12-25 DIAGNOSIS — D125 Benign neoplasm of sigmoid colon: Secondary | ICD-10-CM | POA: Diagnosis not present

## 2020-12-25 DIAGNOSIS — K635 Polyp of colon: Secondary | ICD-10-CM | POA: Diagnosis not present

## 2020-12-25 DIAGNOSIS — K648 Other hemorrhoids: Secondary | ICD-10-CM | POA: Diagnosis not present

## 2020-12-27 DIAGNOSIS — K5140 Inflammatory polyps of colon without complications: Secondary | ICD-10-CM | POA: Diagnosis not present

## 2020-12-27 DIAGNOSIS — K635 Polyp of colon: Secondary | ICD-10-CM | POA: Diagnosis not present

## 2021-02-28 ENCOUNTER — Ambulatory Visit (HOSPITAL_COMMUNITY)
Admission: EM | Admit: 2021-02-28 | Discharge: 2021-02-28 | Disposition: A | Discharge status: Other Acute Inpatient Hospital | Payer: Medicare Other | Attending: Family | Admitting: Family

## 2021-02-28 ENCOUNTER — Other Ambulatory Visit: Payer: Self-pay

## 2021-02-28 ENCOUNTER — Inpatient Hospital Stay (HOSPITAL_COMMUNITY)
Admission: AD | Admit: 2021-02-28 | Discharge: 2021-03-04 | DRG: 885 | Disposition: A | Discharge status: Home / Self Care | Payer: Medicare Other | Source: Intra-hospital | Attending: Emergency Medicine | Admitting: Emergency Medicine

## 2021-02-28 DIAGNOSIS — G4700 Insomnia, unspecified: Secondary | ICD-10-CM | POA: Diagnosis present

## 2021-02-28 DIAGNOSIS — Z79890 Hormone replacement therapy (postmenopausal): Secondary | ICD-10-CM

## 2021-02-28 DIAGNOSIS — Z9151 Personal history of suicidal behavior: Secondary | ICD-10-CM | POA: Diagnosis not present

## 2021-02-28 DIAGNOSIS — I10 Essential (primary) hypertension: Secondary | ICD-10-CM | POA: Diagnosis present

## 2021-02-28 DIAGNOSIS — Z7982 Long term (current) use of aspirin: Secondary | ICD-10-CM | POA: Insufficient documentation

## 2021-02-28 DIAGNOSIS — E876 Hypokalemia: Secondary | ICD-10-CM | POA: Diagnosis not present

## 2021-02-28 DIAGNOSIS — F332 Major depressive disorder, recurrent severe without psychotic features: Secondary | ICD-10-CM | POA: Diagnosis present

## 2021-02-28 DIAGNOSIS — D72829 Elevated white blood cell count, unspecified: Secondary | ICD-10-CM | POA: Diagnosis present

## 2021-02-28 DIAGNOSIS — Z8673 Personal history of transient ischemic attack (TIA), and cerebral infarction without residual deficits: Secondary | ICD-10-CM | POA: Diagnosis not present

## 2021-02-28 DIAGNOSIS — Z87891 Personal history of nicotine dependence: Secondary | ICD-10-CM | POA: Diagnosis not present

## 2021-02-28 DIAGNOSIS — F4310 Post-traumatic stress disorder, unspecified: Secondary | ICD-10-CM | POA: Diagnosis not present

## 2021-02-28 DIAGNOSIS — Z823 Family history of stroke: Secondary | ICD-10-CM | POA: Diagnosis not present

## 2021-02-28 DIAGNOSIS — E039 Hypothyroidism, unspecified: Secondary | ICD-10-CM | POA: Diagnosis present

## 2021-02-28 DIAGNOSIS — R45851 Suicidal ideations: Secondary | ICD-10-CM | POA: Diagnosis not present

## 2021-02-28 DIAGNOSIS — F17210 Nicotine dependence, cigarettes, uncomplicated: Secondary | ICD-10-CM | POA: Insufficient documentation

## 2021-02-28 DIAGNOSIS — Z818 Family history of other mental and behavioral disorders: Secondary | ICD-10-CM | POA: Diagnosis not present

## 2021-02-28 DIAGNOSIS — F419 Anxiety disorder, unspecified: Secondary | ICD-10-CM | POA: Diagnosis present

## 2021-02-28 DIAGNOSIS — Z79899 Other long term (current) drug therapy: Secondary | ICD-10-CM | POA: Insufficient documentation

## 2021-02-28 DIAGNOSIS — E785 Hyperlipidemia, unspecified: Secondary | ICD-10-CM | POA: Diagnosis present

## 2021-02-28 DIAGNOSIS — Z23 Encounter for immunization: Secondary | ICD-10-CM

## 2021-02-28 DIAGNOSIS — Z20822 Contact with and (suspected) exposure to covid-19: Secondary | ICD-10-CM | POA: Insufficient documentation

## 2021-02-28 LAB — COMPREHENSIVE METABOLIC PANEL
ALT: 22 U/L (ref 0–44)
AST: 18 U/L (ref 15–41)
Albumin: 4 g/dL (ref 3.5–5.0)
Alkaline Phosphatase: 98 U/L (ref 38–126)
Anion gap: 11 (ref 5–15)
BUN: 16 mg/dL (ref 8–23)
CO2: 33 mmol/L — ABNORMAL HIGH (ref 22–32)
Calcium: 9.1 mg/dL (ref 8.9–10.3)
Chloride: 97 mmol/L — ABNORMAL LOW (ref 98–111)
Creatinine, Ser: 0.96 mg/dL (ref 0.61–1.24)
GFR, Estimated: >60 mL/min (ref >60)
Glucose, Bld: 105 mg/dL — ABNORMAL HIGH (ref 70–99)
Potassium: 3.3 mmol/L — ABNORMAL LOW (ref 3.5–5.1)
Sodium: 141 mmol/L (ref 135–145)
Total Bilirubin: 0.7 mg/dL (ref 0.3–1.2)
Total Protein: 6.9 g/dL (ref 6.5–8.1)

## 2021-02-28 LAB — CBC WITH DIFFERENTIAL/PLATELET
Abs Immature Granulocytes: 0.05 10*3/uL (ref 0.00–0.07)
Basophils Absolute: 0 10*3/uL (ref 0.0–0.1)
Basophils Relative: 0 %
Eosinophils Absolute: 0.2 10*3/uL (ref 0.0–0.5)
Eosinophils Relative: 2 %
HCT: 50.4 % (ref 39.0–52.0)
Hemoglobin: 16.1 g/dL (ref 13.0–17.0)
Immature Granulocytes: 1 %
Lymphocytes Relative: 13 %
Lymphs Abs: 1.5 10*3/uL (ref 0.7–4.0)
MCH: 28 pg (ref 26.0–34.0)
MCHC: 31.9 g/dL (ref 30.0–36.0)
MCV: 87.7 fL (ref 80.0–100.0)
Monocytes Absolute: 0.8 10*3/uL (ref 0.1–1.0)
Monocytes Relative: 8 %
Neutro Abs: 8.3 10*3/uL — ABNORMAL HIGH (ref 1.7–7.7)
Neutrophils Relative %: 76 %
Platelets: 245 10*3/uL (ref 150–400)
RBC: 5.75 MIL/uL (ref 4.22–5.81)
RDW: 12.9 % (ref 11.5–15.5)
WBC: 10.9 10*3/uL — ABNORMAL HIGH (ref 4.0–10.5)
nRBC: 0 % (ref 0.0–0.2)

## 2021-02-28 LAB — LIPID PANEL
Cholesterol: 155 mg/dL (ref 0–200)
HDL: 51 mg/dL (ref >40)
LDL Cholesterol: 88 mg/dL (ref 0–99)
Total CHOL/HDL Ratio: 3 RATIO
Triglycerides: 81 mg/dL (ref <150)
VLDL: 16 mg/dL (ref 0–40)

## 2021-02-28 LAB — POCT URINE DRUG SCREEN - MANUAL ENTRY (I-SCREEN)
POC Amphetamine UR: NOT DETECTED
POC Buprenorphine (BUP): NOT DETECTED
POC Cocaine UR: NOT DETECTED
POC Marijuana UR: NOT DETECTED
POC Methadone UR: NOT DETECTED
POC Methamphetamine UR: NOT DETECTED
POC Morphine: NOT DETECTED
POC Oxazepam (BZO): NOT DETECTED
POC Oxycodone UR: NOT DETECTED
POC Secobarbital (BAR): NOT DETECTED

## 2021-02-28 LAB — ETHANOL: Alcohol, Ethyl (B): <10 mg/dL (ref <10)

## 2021-02-28 LAB — POC SARS CORONAVIRUS 2 AG -  ED: SARS Coronavirus 2 Ag: NEGATIVE

## 2021-02-28 LAB — RESP PANEL BY RT-PCR (FLU A&B, COVID) ARPGX2
Influenza A by PCR: NEGATIVE
Influenza B by PCR: NEGATIVE
SARS Coronavirus 2 by RT PCR: NEGATIVE

## 2021-02-28 LAB — HEMOGLOBIN A1C
Hgb A1c MFr Bld: 6 % — ABNORMAL HIGH (ref 4.8–5.6)
Mean Plasma Glucose: 125.5 mg/dL

## 2021-02-28 LAB — MAGNESIUM: Magnesium: 2 mg/dL (ref 1.7–2.4)

## 2021-02-28 LAB — TSH: TSH: 1.128 u[IU]/mL (ref 0.350–4.500)

## 2021-02-28 MED ORDER — MAGNESIUM HYDROXIDE 400 MG/5ML PO SUSP: 30.0000 mL | Freq: Every day | ORAL | Status: DC | PRN

## 2021-02-28 MED ORDER — ASPIRIN EC 81 MG PO TBEC
81.0000 mg | DELAYED_RELEASE_TABLET | Freq: Every day | ORAL | Status: DC
  Filled 2021-02-28: qty 1

## 2021-02-28 MED ORDER — ATORVASTATIN CALCIUM 40 MG PO TABS
40.0000 mg | ORAL_TABLET | Freq: Every day | ORAL | Status: DC
  Administered 2021-02-28: 40 mg via ORAL
  Filled 2021-02-28: qty 1

## 2021-02-28 MED ORDER — IBUPROFEN 400 MG PO TABS: 400.0000 mg | ORAL_TABLET | Freq: Once | ORAL | Status: DC

## 2021-02-28 MED ORDER — LEVOTHYROXINE SODIUM 112 MCG PO TABS: 112.0000 ug | ORAL_TABLET | Freq: Every day | ORAL | Status: DC

## 2021-02-28 MED ORDER — AMLODIPINE BESYLATE 5 MG PO TABS
5.0000 mg | ORAL_TABLET | Freq: Every day | ORAL | Status: DC
  Filled 2021-02-28: qty 1

## 2021-02-28 MED ORDER — HYDROCHLOROTHIAZIDE 25 MG PO TABS
25.0000 mg | ORAL_TABLET | Freq: Every day | ORAL | Status: DC
  Filled 2021-02-28: qty 1

## 2021-02-28 MED ORDER — IBUPROFEN 600 MG PO TABS
600.0000 mg | ORAL_TABLET | Freq: Once | ORAL | Status: AC
  Administered 2021-02-28: 600 mg via ORAL
  Filled 2021-02-28: qty 1

## 2021-02-28 MED ORDER — HYDRALAZINE HCL 25 MG PO TABS
25.0000 mg | ORAL_TABLET | Freq: Once | ORAL | Status: AC
  Administered 2021-02-28: 25 mg via ORAL
  Filled 2021-02-28: qty 1

## 2021-02-28 MED ORDER — ALUM & MAG HYDROXIDE-SIMETH 200-200-20 MG/5ML PO SUSP: 30.0000 mL | ORAL | Status: DC | PRN

## 2021-02-28 MED ORDER — TRAZODONE HCL 50 MG PO TABS: 50.0000 mg | ORAL_TABLET | Freq: Every evening | ORAL | Status: DC | PRN

## 2021-02-28 MED ORDER — DULOXETINE HCL 20 MG PO CPEP
40.0000 mg | ORAL_CAPSULE | Freq: Every day | ORAL | Status: DC
  Filled 2021-02-28: qty 2

## 2021-02-28 MED ORDER — ACETAMINOPHEN 325 MG PO TABS: 650.0000 mg | ORAL_TABLET | Freq: Four times a day (QID) | ORAL | Status: DC | PRN

## 2021-02-28 NOTE — ED Provider Notes (Addendum)
Consulted with Dr. Deno Etienne, DO regarding EKG finding. Patient is stable, no complaint of chest pain, sob, distress. Vital signs are stable. Patient stable to discharge to [facility].

## 2021-02-28 NOTE — ED Notes (Signed)
Report given to [email or web address]  adult

## 2021-02-28 NOTE — ED Notes (Signed)
Patient was admitted to the continuous assessment unit. Patient was oriented to unit. Patient was a walk-in patient. Patient reports he was escorted and transported to facility by his brother. Patient has denied SI/HI. Patient  was calm and cooperative throughout the interview process. Patient states he has suicidal thought primarily in the daytime  and it goes back and forth. Patient has contracted for safety while he is here in the facility.

## 2021-02-28 NOTE — ED Notes (Signed)
Pt calm and cooperative. No c/o of pain or distress. Will continue to monitor for safety

## 2021-02-28 NOTE — ED Provider Notes (Signed)
Behavioral Health Admission [facility] & OBS)  Date: 02/28/21 Patient Name: Alec [name] MRN: [id number] Chief Complaint:  Chief Complaint  Patient presents with   Suicidal      Diagnoses:  Final diagnoses:  PTSD (post-traumatic stress disorder)  Severe episode of recurrent major depressive disorder, without psychotic features [facility])    HPI: Patient presents voluntarily to [facility] behavioral health for walk-in assessment.  He reports depressed mood for "all my life." He has been diagnosed with PTSD and depression. He is currently not linked with outpatient psychiatry. He is prescribed Wellbutrin by primary care provider.  Initially felt Wellbutrin was moderately effective, currently would prefer to update to alternative medication.  Patient is unable to articulate current stressors.  He reports chronic and depressed mood. He endorses anhedonia, decreased sleep and low energy level x approx. three years.  Patient is assessed face-to-face by nurse practitioner.  He is seated in assessment area, no acute distress. He is alert and oriented, pleasant and cooperative during assessment.  He reports depressed mood with congruent affect.  He denies homicidal ideations.  He endorses one prior suicide attempt three years ago when he attempted to shoot himself but "missed." He denies history of self-harm. He is unable to contract verbally for safety with this Probation officer.   He has normal speech and behavior.  He denies both auditory and visual hallucinations.  Patient is able to converse coherently with goal-directed thoughts and no distractibility or preoccupation.  He denies paranoia.  Objectively there is no evidence of psychosis/mania or delusional thinking.  Patient resides in [place] with his mother.  He endorses he does have access to weapons in the home however agrees to have his brother, Mortimer Fries, remove weapons at this time.  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   He endorses average appetite and decreased sleep.  He denies alcohol and substance use.  Patient offered support and encouragement.    PHQ 2-9:  [place] Office Visit from 08/28/2019 in [place] Office Visit from 05/30/2019 in [place] Office Visit from 11/01/2018 in [place]  Thoughts that you would be better off dead, or of hurting yourself in some way Not at all Not at all --  PHQ-9 Total Score 18 8 11          Total Time spent with patient: 30 minutes  Musculoskeletal  Strength & Muscle Tone: within normal limits Gait & Station: normal Patient leans: N/A  Psychiatric Specialty Exam  Presentation General Appearance: Appropriate for Environment; Casual  Eye Contact:Good  Speech:Clear and Coherent; Normal Rate  Speech Volume:Normal  Handedness:Right   Mood and Affect  Mood:Depressed  Affect:Depressed; Appropriate; Congruent   Thought Process  Thought Processes:Coherent; Goal Directed; Linear  Descriptions of Associations:Intact  Orientation:Full (Time, Place and Person)  Thought Content:Logical; WDL    Hallucinations:Hallucinations: None  Ideas of Reference:None  Suicidal Thoughts:Suicidal Thoughts: Yes, Passive SI Passive Intent and/or Plan: With Plan; With Intent; With Means to Carry Out  Homicidal Thoughts:Homicidal Thoughts: No   Sensorium  Memory:Immediate Good; Recent Good; Remote Good  Judgment:Good  Insight:Good   Executive Functions  Concentration:Good  Attention Span:Good  [place] of Knowledge:Good  Language:Good   Psychomotor Activity  Psychomotor Activity:Psychomotor Activity: Normal   Assets  Assets:Communication Skills; Desire for Improvement; Financial Resources/Insurance; Housing; Intimacy; Leisure Time; Physical Health; Resilience; Social Support; Talents/Skills; Transportation   Sleep  Sleep:Sleep: Good   Nutritional  Assessment (For  OBS and [facility] admissions only) Has the patient had a weight loss or gain of 10 pounds or more in the last 3 months?: No Has the patient had a decrease in food intake/or appetite?: No Does the patient have dental problems?: No Does the patient have eating habits or behaviors that may be indicators of an eating disorder including binging or inducing vomiting?: No Has the patient recently lost weight without trying?: 0 Has the patient been eating poorly because of a decreased appetite?: 0 Malnutrition Screening Tool Score: 0   Physical Exam Vitals and nursing note reviewed.  Constitutional:      Appearance: Normal appearance. He is well-developed. He is obese.  HENT:     Head: Normocephalic and atraumatic.     Nose: Nose normal.  Cardiovascular:     Rate and Rhythm: Normal rate.  Pulmonary:     Effort: Pulmonary effort is normal.  Musculoskeletal:        General: Normal range of motion.     Cervical back: Normal range of motion.  Skin:    General: Skin is warm and dry.  Neurological:     Mental Status: He is alert and oriented to person, place, and time.  Psychiatric:        Attention and Perception: Attention and perception normal.        Mood and Affect: Affect normal. Mood is depressed.        Speech: Speech normal.        Behavior: Behavior normal. Behavior is cooperative.        Thought Content: Thought content includes suicidal ideation. Thought content includes suicidal plan.        Cognition and Memory: Cognition and memory normal.        Judgment: Judgment normal.   Review of Systems  Constitutional: Negative.   HENT: Negative.    Eyes: Negative.   Respiratory: Negative.    Cardiovascular: Negative.   Gastrointestinal: Negative.   Genitourinary: Negative.   Musculoskeletal: Negative.   Skin: Negative.   Neurological: Negative.   Endo/Heme/Allergies: Negative.   Psychiatric/Behavioral:  Positive for depression and suicidal ideas.    Blood pressure  (!) 162/90, pulse 74, temperature 98.7 F (37.1 C), temperature source Oral, resp. rate 18, SpO2 99 %. There is no height or weight on file to calculate BMI.  Past Psychiatric History: PTSD, depression  Is the patient at risk to self? Yes  Has the patient been a risk to self in the past 6 months? Yes .    Has the patient been a risk to self within the distant past? Yes   Is the patient a risk to others? No   Has the patient been a risk to others in the past 6 months? No   Has the patient been a risk to others within the distant past? No   Past Medical History:  Past Medical History:  Diagnosis Date   Depression    sees psychiatrist at behavioral health   Hyperlipidemia    Hypertension    Hypothyroidism    Stroke [facility]) 2004   acute thalamic infarct, started on aspirin at that time    Past Surgical History:  Procedure Laterality Date   BRANCHIAL CLEFT CYST EXCISION      Family History:  Family History  Problem Relation Age of Onset   Stroke Father     Social History:  Social History   Socioeconomic History   Marital status: Single    Spouse name: Not on  file   Number of children: Not on file   Years of education: Not on file   Highest education level: Not on file  Occupational History   Not on file  Tobacco Use   Smoking status: Former    Types: Cigarettes    Quit date: 05/25/1994    Years since quitting: 26.7   Smokeless tobacco: Never  Substance and Sexual Activity   Alcohol use: No   Drug use: No   Sexual activity: Not on file  Other Topics Concern   Not on file  Social History Narrative   Not on file   Social Determinants of Health   Financial Resource Strain: Not on file  Food Insecurity: Not on file  Transportation Needs: Not on file  Physical Activity: Not on file  Stress: Not on file  Social Connections: Not on file  Intimate Partner Violence: Not on file    SDOH:  SDOH Screenings   Alcohol Screen: Not on file  Depression (PHQ2-9): Not on  file  Financial Resource Strain: Not on file  Food Insecurity: Not on file  Housing: Not on file  Physical Activity: Not on file  Social Connections: Not on file  Stress: Not on file  Tobacco Use: Medium Risk   Smoking Tobacco Use: Former   Smokeless Tobacco Use: Never  Transportation Needs: Not on file    Last Labs:  No visits with results within 6 Month(s) from this visit.  Latest known visit with results is:  Office Visit on 08/28/2019  Component Date Value Ref Range Status   TSH 08/28/2019 1.140  0.450 - 4.500 uIU/mL Final    Allergies: Procaine hcl  PTA Medications: (Not in a hospital admission)   Medical Decision Making  Patient reviewed with Dr. Hampton Abbot.  Inpatient psychiatric treatment recommended.  If he refuses inpatient psychiatric treatment he does meet criteria for involuntary commitment petition, related to his suicidal ideation with plan and intent and access to means. Laboratory studies ordered including CBC, CMP, ethanol, A1c, hepatic function, lipid panel, magnesium, prolactin and TSH.  Urine drug screen order initiated.  EKG ordered.  Current medications: -Acetaminophen 650 mg every 6 as needed/mouth pain -Maalox 30 mL oral every 4 as needed/digestion -Amlodipine 5 mg daily -Aspirin EC 81 mg daily -Atorvastatin 40 mg daily -Duloxetine DR 40 mg daily -Hydrochlorothiazide 25 mg daily -Levothyroxine 112 mcg daily -Magnesium hydroxide 30 mL daily as needed/mild constipation -Trazodone 50 mg nightly as needed/sleep     Recommendations  Based on my evaluation the patient does not appear to have an emergency medical condition.  Lucky Rathke, FNP 02/28/21  1:36 PM

## 2021-03-01 ENCOUNTER — Encounter (HOSPITAL_COMMUNITY): Payer: Self-pay | Admitting: Family

## 2021-03-01 DIAGNOSIS — F332 Major depressive disorder, recurrent severe without psychotic features: Secondary | ICD-10-CM | POA: Diagnosis present

## 2021-03-01 MED ORDER — MAGNESIUM HYDROXIDE 400 MG/5ML PO SUSP: 30.0000 mL | Freq: Every day | ORAL | Status: DC | PRN

## 2021-03-01 MED ORDER — VENLAFAXINE HCL ER 37.5 MG PO CP24
37.5000 mg | ORAL_CAPSULE | Freq: Every day | ORAL | Status: DC
  Administered 2021-03-02: 37.5 mg via ORAL
  Filled 2021-03-01 (×2): qty 1

## 2021-03-01 MED ORDER — ALUM & MAG HYDROXIDE-SIMETH 200-200-20 MG/5ML PO SUSP: 30.0000 mL | ORAL | Status: DC | PRN

## 2021-03-01 MED ORDER — QUETIAPINE FUMARATE 50 MG PO TABS
50.0000 mg | ORAL_TABLET | Freq: Every day | ORAL | Status: DC
  Administered 2021-03-01 – 2021-03-03 (×3): 50 mg via ORAL
  Filled 2021-03-01 (×6): qty 1

## 2021-03-01 MED ORDER — TRAZODONE HCL 50 MG PO TABS
50.0000 mg | ORAL_TABLET | Freq: Every evening | ORAL | Status: DC | PRN
  Administered 2021-03-01: 50 mg via ORAL
  Filled 2021-03-01: qty 1

## 2021-03-01 MED ORDER — DULOXETINE HCL 20 MG PO CPEP
40.0000 mg | ORAL_CAPSULE | Freq: Every day | ORAL | Status: DC
  Administered 2021-03-01: 40 mg via ORAL
  Filled 2021-03-01 (×3): qty 2

## 2021-03-01 MED ORDER — HYDROCHLOROTHIAZIDE 25 MG PO TABS
25.0000 mg | ORAL_TABLET | Freq: Every day | ORAL | Status: DC
  Administered 2021-03-01 – 2021-03-04 (×4): 25 mg via ORAL
  Filled 2021-03-01 (×7): qty 1

## 2021-03-01 MED ORDER — MAGNESIUM OXIDE -MG SUPPLEMENT 400 (240 MG) MG PO TABS
400.0000 mg | ORAL_TABLET | Freq: Every day | ORAL | Status: DC
  Administered 2021-03-01 – 2021-03-03 (×3): 400 mg via ORAL
  Filled 2021-03-01 (×6): qty 1

## 2021-03-01 MED ORDER — ATORVASTATIN CALCIUM 40 MG PO TABS
40.0000 mg | ORAL_TABLET | Freq: Every day | ORAL | Status: DC
  Administered 2021-03-01 – 2021-03-03 (×4): 40 mg via ORAL
  Filled 2021-03-01 (×7): qty 1

## 2021-03-01 MED ORDER — ACETAMINOPHEN 325 MG PO TABS
650.0000 mg | ORAL_TABLET | Freq: Four times a day (QID) | ORAL | Status: DC | PRN
  Administered 2021-03-01 – 2021-03-03 (×3): 650 mg via ORAL
  Filled 2021-03-01 (×3): qty 2

## 2021-03-01 MED ORDER — AMLODIPINE BESYLATE 5 MG PO TABS
5.0000 mg | ORAL_TABLET | Freq: Every day | ORAL | Status: DC
  Administered 2021-03-01 – 2021-03-04 (×4): 5 mg via ORAL
  Filled 2021-03-01 (×7): qty 1

## 2021-03-01 MED ORDER — ATORVASTATIN CALCIUM 40 MG PO TABS
40.0000 mg | ORAL_TABLET | Freq: Every day | ORAL | Status: DC
  Filled 2021-03-01: qty 1

## 2021-03-01 MED ORDER — INFLUENZA VAC A&B SA ADJ QUAD 0.5 ML IM PRSY
0.5000 mL | PREFILLED_SYRINGE | INTRAMUSCULAR | Status: AC
  Administered 2021-03-02: 0.5 mL via INTRAMUSCULAR
  Filled 2021-03-01 (×2): qty 0.5

## 2021-03-01 MED ORDER — ASPIRIN EC 81 MG PO TBEC
81.0000 mg | DELAYED_RELEASE_TABLET | Freq: Every day | ORAL | Status: DC
  Administered 2021-03-01 – 2021-03-04 (×4): 81 mg via ORAL
  Filled 2021-03-01 (×7): qty 1

## 2021-03-01 MED ORDER — LEVOTHYROXINE SODIUM 112 MCG PO TABS
112.0000 ug | ORAL_TABLET | Freq: Every day | ORAL | Status: DC
  Administered 2021-03-01 – 2021-03-04 (×4): 112 ug via ORAL
  Filled 2021-03-01 (×7): qty 1

## 2021-03-01 NOTE — Progress Notes (Addendum)
   03/01/21 0900  Psych Admission Type (Psych Patients Only)  Admission Status Voluntary  Psychosocial Assessment  Patient Complaints Depression  Eye Contact Brief  Facial Expression Sad  Affect Sad  Speech Logical/coherent  Interaction Cautious  Motor Activity Slow  Appearance/Hygiene Unremarkable  Behavior Characteristics Cooperative  Mood Depressed;Anxious  Thought Process  Coherency WDL  Content WDL  Delusions WDL  Perception WDL  Hallucination None reported or observed  Judgment Impaired  Confusion WDL  Danger to Self  Current suicidal ideation? Denies  Danger to Others  Danger to Others None reported or observed   D.  Pt presents as depressed- brightens a little during interactions. Pt has been visible in the milieu and observed attending groups  today. Pt currently denies SI/HI and AVH  A. Labs and vitals monitored. Pt given and educated on medications. Pt supported emotionally and encouraged to express concerns and ask questions.   R. Pt remains safe with 15 minute checks. Will continue POC.
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
## 2021-03-01 NOTE — Progress Notes (Signed)
Patient did not attend wrap up group. 

## 2021-03-01 NOTE — BHH Group Notes (Signed)
Adult Psychoeducational Group Note  Date:  03/01/2021 Time:  11:27 AM  Group Topic/Focus:  Goals Group:   The focus of this group is to help patients establish daily goals to achieve during treatment and discuss how the patient can incorporate goal setting into their daily lives to aide in recovery.  Participation Level:  Active  Participation Quality:  Appropriate  Affect:  Appropriate  Cognitive:  Appropriate  Insight: Appropriate  Engagement in Group:  Engaged  Modes of Intervention:  Discussion  Additional Comments:  Patient attended group and said that his goal for today is to take a shower, get his prescription right and get some sleep. Tech told patient to kindly inform his physician about his goals.   Alec [name] W Alec [name] 95/10/3873, 11:27 AM

## 2021-03-01 NOTE — BHH Suicide Risk Assessment (Addendum)
[place] INPATIENT:  Family/Significant Other Suicide Prevention Education  Suicide Prevention Education:  Contact Attempts: Fadil Macmaster (brother) @ [phone], has been identified by the patient as the family member/significant other with whom the patient will be residing, and identified as the person(s) who will aid the patient in the event of a mental health crisis.  With written consent from the patient, two attempts were made to provide suicide prevention education, prior to and/or following the patient's discharge.  We were unsuccessful in providing suicide prevention education.  A suicide education pamphlet was given to the patient to share with family/significant other.  Date and time of first attempt: 03/01/2021 @ 4:05 PM Date and time of second attempt:CSW team will make additional attempts to reach brother in order to confirm that he has removed weapons from home. Patient stated that the plan was for his brother to secure all weapons. Prior to hospitalization, patient had an unsecured .38 special revolver (handgun).   Durenda Hurt 03/01/2021, 4:04 PM

## 2021-03-01 NOTE — Group Note (Signed)
[facility] LCSW Group Therapy Note   Group Date: 03/01/2021 Start Time: 1000 End Time: 1030   Type of Therapy/Topic:  Group Therapy:  Emotion Regulation  Participation Level:  Active    Description of Group:    The purpose of this group is to assist patients in learning to regulate negative emotions and experience positive emotions. Patients will be guided to discuss ways in which they have been vulnerable to their negative emotions. These vulnerabilities will be juxtaposed with experiences of positive emotions or situations, and patients challenged to use positive emotions to combat negative ones. Special emphasis will be placed on coping with negative emotions in conflict situations, and patients will process healthy conflict resolution skills.  Therapeutic Goals: Patient will identify two positive emotions or experiences to reflect on in order to balance out negative emotions:  Patient will label two or more emotions that they find the most difficult to experience:  Patient will be able to demonstrate positive conflict resolution skills through discussion or role plays:   Summary of Patient Progress: Pt participated and shared during introductions that he likes to ride his motorcycle to regulate his emotions.   Therapeutic Modalities:   Cognitive Behavioral Therapy Feelings Identification Dialectical Behavioral Therapy   Mliss Fritz, LCSWA

## 2021-03-01 NOTE — BHH Group Notes (Signed)
Adult Psychoeducational Group Note  Date:  03/01/2021 Time:  5:24 PM  Group Topic/Focus:  Developing a Wellness Toolbox:   The focus of this group is to help patients develop a "wellness toolbox" with skills and strategies to promote recovery upon discharge.  Participation Level:  Active  Participation Quality:  Appropriate  Affect:  Appropriate  Cognitive:  Appropriate  Insight: Appropriate  Engagement in Group:  Engaged  Modes of Intervention:  Discussion  Additional Comments: Patient attended Forgiveness group and participated.   Jenan Ellegood W Zerline Melchior 48/09/4625, 5:24 PM

## 2021-03-01 NOTE — Plan of Care (Signed)
  Problem: Education: Goal: Ability to state activities that reduce stress will improve Outcome: Progressing   Problem: Self-Concept: Goal: Ability to identify factors that promote anxiety will improve Outcome: Progressing Goal: Level of anxiety will decrease Outcome: Progressing

## 2021-03-01 NOTE — Tx Team (Signed)
Initial Treatment Plan 03/01/2021 3:21 AM Alec [name] SOR:[id number]    PATIENT STRESSORS: Financial difficulties   Health problems   Marital or family conflict   Medication change or noncompliance   Traumatic event     PATIENT STRENGTHS: Ability for insight  Capable of independent living  Communication skills  General fund of knowledge  Motivation for treatment/growth  Work skills    PATIENT IDENTIFIED PROBLEMS: PTSD  Depression  Suicidal ideation  Anxiety  Care giver of 66 year old mother    "Get help with medications, Wellbutrin not working"  "Therapy"       DISCHARGE CRITERIA:  Improved stabilization in mood, thinking, and/or behavior Need for constant or close observation no longer present Reduction of life-threatening or endangering symptoms to within safe limits Verbal commitment to aftercare and medication compliance  PRELIMINARY DISCHARGE PLAN: Outpatient therapy Return to previous living arrangement Medication management  PATIENT/FAMILY INVOLVEMENT: This treatment plan has been presented to and reviewed with the patient, Alec [name], and/or family member.  The patient and family have been given the opportunity to ask questions and make suggestions.  Windell Moment, RN 03/01/2021, 3:21 AM
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
## 2021-03-01 NOTE — BHH Counselor (Signed)
Alec [name], patient, provided written consent for CSW team to coordinate aftercare at this time. CSW to have further conversations with patient regarding care in the community.   Patient states that he sees April Fornsby, an independent therapist at [facility].   Signed:  Durenda Hurt, MSW, [place], LCASA 03/01/2021 4:18 PM
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
## 2021-03-01 NOTE — BHH Group Notes (Signed)
Adult Psychoeducational Group Note  Date:  03/01/2021 Time:  3:00 PM  Group Topic/Focus:  Wellness Toolbox:   The focus of this group is to discuss various aspects of wellness, balancing those aspects and exploring ways to increase the ability to experience wellness.  Patients will create a wellness toolbox for use upon discharge.  Participation Level:  Active  Participation Quality:  Appropriate  Affect:  Appropriate  Cognitive:  Appropriate  Insight: Appropriate  Engagement in Group:  Engaged  Modes of Intervention:  Discussion  Additional Comments:   Patient attended Support System group led by the nurse and participated.   Thermon Zulauf W Shakiera Edelson 17/01/1504, 3:00 PM

## 2021-03-01 NOTE — Progress Notes (Signed)
   03/01/21 2259  Psych Admission Type (Psych Patients Only)  Admission Status Voluntary  Psychosocial Assessment  Patient Complaints Depression  Eye Contact Brief  Facial Expression Flat  Affect Appropriate to circumstance  Speech Logical/coherent  Interaction Guarded  Motor Activity Slow  Appearance/Hygiene Unremarkable  Behavior Characteristics Appropriate to situation  Mood Depressed;Pleasant  Thought Process  Coherency WDL  Content WDL  Delusions None reported or observed  Perception WDL  Hallucination None reported or observed  Judgment Impaired  Confusion None  Danger to Self  Current suicidal ideation? Denies  Danger to Others  Danger to Others None reported or observed

## 2021-03-02 MED ORDER — POTASSIUM CHLORIDE CRYS ER 20 MEQ PO TBCR
40.0000 meq | EXTENDED_RELEASE_TABLET | Freq: Once | ORAL | Status: AC
  Administered 2021-03-02: 40 meq via ORAL
  Filled 2021-03-02 (×2): qty 2

## 2021-03-02 MED ORDER — VENLAFAXINE HCL ER 75 MG PO CP24
75.0000 mg | ORAL_CAPSULE | Freq: Every day | ORAL | Status: DC
Start: 2021-03-03 — End: 2021-03-04
  Administered 2021-03-03 – 2021-03-04 (×2): 75 mg via ORAL
  Filled 2021-03-02 (×4): qty 1

## 2021-03-02 NOTE — BHH Group Notes (Signed)
Pt attended Relaxation Group. Pt watched a movie and ate snack and went over packets that were given out this afternoon.

## 2021-03-02 NOTE — Progress Notes (Addendum)
   03/02/21 1300  Psych Admission Type (Psych Patients Only)  Admission Status Voluntary  Psychosocial Assessment  Patient Complaints None  Eye Contact Brief  Facial Expression Flat  Affect Appropriate to circumstance  Speech Logical/coherent  Interaction Guarded  Motor Activity Slow  Appearance/Hygiene Unremarkable  Behavior Characteristics Cooperative;Appropriate to situation  Mood Pleasant  Thought Process  Coherency WDL  Content WDL  Delusions None reported or observed  Perception WDL  Hallucination None reported or observed  Judgment Impaired  Confusion None  Danger to Self  Current suicidal ideation? Denies  Danger to Others  Danger to Others None reported or observed    D. Pt presents as friendly during interactions- remained in the milieu throughout the shift- observed interacting with peers and staff, and attending groups. Per pt's self inventory, pt rated his depression, hopelessness and anxiety a 2/1/1, respectively Pt currently denies SI/HI and AVH A. Labs and vitals monitored. Pt given and educated on medications. Pt supported emotionally and encouraged to express concerns and ask questions.   R. Pt remains safe with 15 minute checks. Will continue POC.

## 2021-03-02 NOTE — Progress Notes (Signed)
[place] Group Notes:  (Nursing/MHT/Case Management/Adjunct)  Date:  03/02/2021  Time:  2000  Type of Therapy:   wrap up group  Participation Level:  Active  Participation Quality:  Appropriate, Attentive, Sharing, and Supportive  Affect:  Depressed  Cognitive:  Alert  Insight:  Improving  Engagement in Group:  Engaged  Modes of Intervention:  Clarification, Education, and Support  Summary of Progress/Problems: Positive thinking and positive change were discussed.   Shellia Cleverly 03/02/2021, 9:28 PM

## 2021-03-02 NOTE — Progress Notes (Signed)
[facility] MD Progress Note  03/02/2021 11:50 AM ROMUALDO PROSISE  MRN:  [id number] Subjective:   Mr. Hagood is a 65 yr old male with a PPHx of Depression and PTSD who was admitted for SI.   Psychiatric team made the following recommendations yesterday: -Stop Duloxetine -Start Effexor XR 37.5 mg daily on 10/9 -Start Seroquel 50 mg QHS   Case was discussed in the multidisciplinary team. MAR was reviewed and patient was compliant with medications.   On interview today patient is more upbeat.  He reports that his mood is doing good because he got a full nights sleep even needing to be woken up to go to breakfast.    He reports no SI, HI, or AVH.   He reports that he is having no side effects from the medication.  Discussed increasing his Effexor tomorrow morning and he was agreeable to this.  He reports that he is worried about his mother and frustrated with his brothers.  He reports that his brothers have not checked on their mother and he is worried that she might fall down and not be helped for hours.  He reports that he has called his mother a few times and so far she has been fine.  He reports no other concerns at present.    Principal Problem: MDD (major depressive disorder), recurrent severe, without psychosis ([place]) Diagnosis: Principal Problem:   MDD (major depressive disorder), recurrent severe, without psychosis ([place]) Active Problems:   Anxiety disorder, unspecified  Total Time spent with patient: 30 minutes  Past Psychiatric History: Depression and PTSD  Past Medical History:  Past Medical History:  Diagnosis Date   Depression    sees psychiatrist at behavioral health   Hyperlipidemia    Hypertension    Hypothyroidism    Stroke [facility]) 2004   acute thalamic infarct, started on aspirin at that time    Past Surgical History:  Procedure Laterality Date   BRANCHIAL CLEFT CYST EXCISION     Family History:  Family History  Problem Relation Age of Onset   Stroke Father     Family Psychiatric  History: Father- admitted to [facility] once (most likely PTSD) Brother- paranoid schizophrenia Social History:  Social History   Substance and Sexual Activity  Alcohol Use No     Social History   Substance and Sexual Activity  Drug Use No    Social History   Socioeconomic History   Marital status: Divorced    Spouse name: Not on file   Number of children: Not on file   Years of education: Not on file   Highest education level: Not on file  Occupational History   Not on file  Tobacco Use   Smoking status: Former    Types: Cigarettes    Quit date: 05/25/1994    Years since quitting: 26.7   Smokeless tobacco: Never  Substance and Sexual Activity   Alcohol use: No   Drug use: No   Sexual activity: Not Currently  Other Topics Concern   Not on file  Social History Narrative   Not on file   Social Determinants of Health   Financial Resource Strain: Not on file  Food Insecurity: Not on file  Transportation Needs: Not on file  Physical Activity: Not on file  Stress: Not on file  Social Connections: Not on file   Additional Social History:  Sleep: Good  Appetite:  Good  Current Medications: Current Facility-Administered Medications  Medication Dose Route Frequency Provider Last Rate Last Admin   acetaminophen (TYLENOL) tablet 650 mg  650 mg Oral Q6H PRN Lucky Rathke, FNP   650 mg at 03/01/21 1827   alum & mag hydroxide-simeth (MAALOX/MYLANTA) 200-200-20 MG/5ML suspension 30 mL  30 mL Oral Q4H PRN Lucky Rathke, FNP       amLODipine (NORVASC) tablet 5 mg  5 mg Oral Daily Lucky Rathke, FNP   5 mg at 03/02/21 0816   aspirin EC tablet 81 mg  81 mg Oral Daily Lucky Rathke, FNP   81 mg at 03/02/21 0816   atorvastatin (LIPITOR) tablet 40 mg  40 mg Oral QHS Bobbitt, Shalon E, NP   40 mg at 03/01/21 2116   hydrochlorothiazide (HYDRODIURIL) tablet 25 mg  25 mg Oral Daily Lucky Rathke, FNP   25 mg at 03/02/21 0816    influenza vaccine adjuvanted (FLUAD) injection 0.5 mL  0.5 mL Intramuscular Tomorrow-1000 Harlow Asa, MD       levothyroxine (SYNTHROID) tablet 112 mcg  112 mcg Oral Q0600 Lucky Rathke, FNP   112 mcg at 03/02/21 7782   magnesium hydroxide (MILK OF MAGNESIA) suspension 30 mL  30 mL Oral Daily PRN Lucky Rathke, FNP       magnesium oxide (MAG-OX) tablet 400 mg  400 mg Oral QHS Bobbitt, Shalon E, NP   400 mg at 03/01/21 2116   QUEtiapine (SEROQUEL) tablet 50 mg  50 mg Oral QHS Briant Cedar, MD   50 mg at 03/01/21 2116   [START ON 03/03/2021] venlafaxine XR (EFFEXOR-XR) 24 hr capsule 75 mg  75 mg Oral Q breakfast Briant Cedar, MD        Lab Results:  Results for orders placed or performed during the hospital encounter of 02/28/21 (from the past 48 hour(s))  CBC with Differential/Platelet     Status: Abnormal   Collection Time: 02/28/21  3:50 PM  Result Value Ref Range   WBC 10.9 (H) 4.0 - 10.5 K/uL   RBC 5.75 4.22 - 5.81 MIL/uL   Hemoglobin 16.1 13.0 - 17.0 g/dL   HCT 50.4 39.0 - 52.0 %   MCV 87.7 80.0 - 100.0 fL   MCH 28.0 26.0 - 34.0 pg   MCHC 31.9 30.0 - 36.0 g/dL   RDW 12.9 11.5 - 15.5 %   Platelets 245 150 - 400 K/uL   nRBC 0.0 0.0 - 0.2 %   Neutrophils Relative % 76 %   Neutro Abs 8.3 (H) 1.7 - 7.7 K/uL   Lymphocytes Relative 13 %   Lymphs Abs 1.5 0.7 - 4.0 K/uL   Monocytes Relative 8 %   Monocytes Absolute 0.8 0.1 - 1.0 K/uL   Eosinophils Relative 2 %   Eosinophils Absolute 0.2 0.0 - 0.5 K/uL   Basophils Relative 0 %   Basophils Absolute 0.0 0.0 - 0.1 K/uL   Immature Granulocytes 1 %   Abs Immature Granulocytes 0.05 0.00 - 0.07 K/uL    Comment: Performed at [facility], [address], [place], [place] [address]  Comprehensive metabolic panel     Status: Abnormal   Collection Time: 02/28/21  3:50 PM  Result Value Ref Range   Sodium 141 135 - 145 mmol/L   Potassium 3.3 (L) 3.5 - 5.1 mmol/L   Chloride 97 (L) 98 - 111 mmol/L   CO2 33 (H) 22 -  32 mmol/L   Glucose, Bld 105 (H) 70 - 99 mg/dL    Comment: Glucose reference range applies only to samples taken after fasting for at least 8 hours.   BUN 16 8 - 23 mg/dL   Creatinine, Ser 0.96 0.61 - 1.24 mg/dL   Calcium 9.1 8.9 - 10.3 mg/dL   Total Protein 6.9 6.5 - 8.1 g/dL   Albumin 4.0 3.5 - 5.0 g/dL   AST 18 15 - 41 U/L   ALT 22 0 - 44 U/L   Alkaline Phosphatase 98 38 - 126 U/L   Total Bilirubin 0.7 0.3 - 1.2 mg/dL   GFR, Estimated >60 >60 mL/min    Comment: (NOTE) Calculated using the CKD-EPI Creatinine Equation (2021)    Anion gap 11 5 - 15    Comment: Performed at [facility] [address] [place], [place] [address]  Hemoglobin A1c     Status: Abnormal   Collection Time: 02/28/21  3:50 PM  Result Value Ref Range   Hgb A1c MFr Bld 6.0 (H) 4.8 - 5.6 %    Comment: (NOTE) Pre diabetes:          5.7%-6.4%  Diabetes:              >6.4%  Glycemic control for   <7.0% adults with diabetes    Mean Plasma Glucose 125.5 mg/dL    Comment: Performed at [facility] [address], [place], [place] [address]  Magnesium     Status: None   Collection Time: 02/28/21  3:50 PM  Result Value Ref Range   Magnesium 2.0 1.7 - 2.4 mg/dL    Comment: Performed at [facility] [address] [place], [place] [address]  Ethanol     Status: None   Collection Time: 02/28/21  3:50 PM  Result Value Ref Range   Alcohol, Ethyl (B) <10 <10 mg/dL    Comment: (NOTE) Lowest detectable limit for serum alcohol is 10 mg/dL.  For medical purposes only. Performed at [facility], [place] [address], [place], [place] [address]   TSH     Status: None   Collection Time: 02/28/21  3:50 PM  Result Value Ref Range   TSH 1.128 0.350 - 4.500 uIU/mL    Comment: Performed by a 3rd Generation assay with a functional sensitivity of <=0.01 uIU/mL. Performed at [facility], [place] [address] [place], [place] [address]   Lipid panel     Status: None   Collection Time: 02/28/21   3:50 PM  Result Value Ref Range   Cholesterol 155 0 - 200 mg/dL   Triglycerides 81 <150 mg/dL   HDL 51 >40 mg/dL   Total CHOL/HDL Ratio 3.0 RATIO   VLDL 16 0 - 40 mg/dL   LDL Cholesterol 88 0 - 99 mg/dL    Comment:        Total Cholesterol/HDL:CHD Risk Coronary Heart Disease Risk Table                     Men   Women  1/2 Average Risk   3.4   3.3  Average Risk       5.0   4.4  2 X Average Risk   9.6   7.1  3 X Average Risk  23.4   11.0        Use the calculated Patient Ratio above and the CHD Risk Table to determine the patient's CHD Risk.  ATP III CLASSIFICATION (LDL):  <100     mg/dL   Optimal  100-129  mg/dL   Near or Above                    Optimal  130-159  mg/dL   Borderline  160-189  mg/dL   High  >190     mg/dL   Very High Performed at [facility] [address] [place], [place] [address]   Resp Panel by RT-PCR (Flu A&B, Covid) Nasopharyngeal Swab     Status: None   Collection Time: 02/28/21  3:54 PM   Specimen: Nasopharyngeal Swab; Nasopharyngeal(NP) swabs in vial transport medium  Result Value Ref Range   SARS Coronavirus 2 by RT PCR NEGATIVE NEGATIVE    Comment: (NOTE) SARS-CoV-2 target nucleic acids are NOT DETECTED.  The SARS-CoV-2 RNA is generally detectable in upper respiratory specimens during the acute phase of infection. The lowest concentration of SARS-CoV-2 viral copies this assay can detect is 138 copies/mL. A negative result does not preclude SARS-Cov-2 infection and should not be used as the sole basis for treatment or other patient management decisions. A negative result may occur with  improper specimen collection/handling, submission of specimen other than nasopharyngeal swab, presence of viral mutation(s) within the areas targeted by this assay, and inadequate number of viral copies(<138 copies/mL). A negative result must be combined with clinical observations, patient history, and epidemiological information. The expected  result is Negative.  Fact Sheet for Patients:  EntrepreneurPulse.com.au  Fact Sheet for Healthcare Providers:  IncredibleEmployment.be  This test is no t yet approved or cleared by the [place] FDA and  has been authorized for detection and/or diagnosis of SARS-CoV-2 by FDA under an Emergency Use Authorization (EUA). This EUA will remain  in effect (meaning this test can be used) for the duration of the COVID-19 declaration under Section 564(b)(1) of the Act, 21 U.S.C.section 360bbb-3(b)(1), unless the authorization is terminated  or revoked sooner.       Influenza A by PCR NEGATIVE NEGATIVE   Influenza B by PCR NEGATIVE NEGATIVE    Comment: (NOTE) The Xpert Xpress SARS-CoV-2/FLU/RSV plus assay is intended as an aid in the diagnosis of influenza from Nasopharyngeal swab specimens and should not be used as a sole basis for treatment. Nasal washings and aspirates are unacceptable for Xpert Xpress SARS-CoV-2/FLU/RSV testing.  Fact Sheet for Patients: EntrepreneurPulse.com.au  Fact Sheet for Healthcare Providers: IncredibleEmployment.be  This test is not yet approved or cleared by the [place] FDA and has been authorized for detection and/or diagnosis of SARS-CoV-2 by FDA under an Emergency Use Authorization (EUA). This EUA will remain in effect (meaning this test can be used) for the duration of the COVID-19 declaration under Section 564(b)(1) of the Act, 21 U.S.C. section 360bbb-3(b)(1), unless the authorization is terminated or revoked.  Performed at [facility], [place] [address], [place], [place] [address]   POC SARS Coronavirus 2 Ag-ED - Nasal Swab     Status: None   Collection Time: 02/28/21  3:57 PM  Result Value Ref Range   SARS Coronavirus 2 Ag Negative Negative  POCT Urine Drug Screen - (IC[facility])     Status: Normal   Collection Time: 02/28/21  4:08 PM  Result Value Ref Range    POC Amphetamine UR None Detected NONE DETECTED (Cut Off Level 1000 ng/mL)   POC Secobarbital (BAR) None Detected NONE DETECTED (Cut Off Level 300 ng/mL)   POC Buprenorphine (BUP)  None Detected NONE DETECTED (Cut Off Level 10 ng/mL)   POC Oxazepam (BZO) None Detected NONE DETECTED (Cut Off Level 300 ng/mL)   POC Cocaine UR None Detected NONE DETECTED (Cut Off Level 300 ng/mL)   POC Methamphetamine UR None Detected NONE DETECTED (Cut Off Level 1000 ng/mL)   POC Morphine None Detected NONE DETECTED (Cut Off Level 300 ng/mL)   POC Oxycodone UR None Detected NONE DETECTED (Cut Off Level 100 ng/mL)   POC Methadone UR None Detected NONE DETECTED (Cut Off Level 300 ng/mL)   POC Marijuana UR None Detected NONE DETECTED (Cut Off Level 50 ng/mL)    Blood Alcohol level:  Lab Results  Component Value Date   ETH <10 70/05/7492    Metabolic Disorder Labs: Lab Results  Component Value Date   HGBA1C 6.0 (H) 02/28/2021   MPG 125.5 02/28/2021   No results found for: PROLACTIN Lab Results  Component Value Date   CHOL 155 02/28/2021   TRIG 81 02/28/2021   HDL 51 02/28/2021   CHOLHDL 3.0 02/28/2021   VLDL 16 02/28/2021   LDLCALC 88 02/28/2021   LDLCALC 90 08/28/2016    Physical Findings:  Musculoskeletal: Strength & Muscle Tone: within normal limits Gait & Station: normal Patient leans: N/A  Psychiatric Specialty Exam:  Presentation  General Appearance: Appropriate for Environment; Casual; Fairly Groomed  Eye Contact:Good  Speech:Clear and Coherent; Normal Rate  Speech Volume:Normal  Handedness:Right   Mood and Affect  Mood:Euthymic  Affect:Appropriate; Congruent   Thought Process  Thought Processes:Coherent; Goal Directed  Descriptions of Associations:Intact  Orientation:Full (Time, Place and Person)  Thought Content:Logical; WDL  History of Schizophrenia/Schizoaffective disorder:No data recorded Duration of Psychotic Symptoms:No data  recorded Hallucinations:Hallucinations: None Ideas of Reference:None  Suicidal Thoughts:Suicidal Thoughts: No Homicidal Thoughts:Homicidal Thoughts: No  Sensorium  Memory:Immediate Good; Recent Good  Judgment:Good  Insight:Good   Executive Functions  Concentration:Good  Attention Span:Good  [place] of Knowledge:Good  Language:Good   Psychomotor Activity  Psychomotor Activity: Psychomotor Activity: Normal  Assets  Assets:Communication Skills; Desire for Improvement; Resilience   Sleep  Sleep: Sleep: Good Number of Hours of Sleep: 6.75   Physical Exam: Physical Exam Vitals and nursing note reviewed.  Constitutional:      General: He is not in acute distress.    Appearance: Normal appearance. He is normal weight. He is not ill-appearing or toxic-appearing.  HENT:     Head: Normocephalic and atraumatic.  Pulmonary:     Effort: Pulmonary effort is normal.  Musculoskeletal:        General: Normal range of motion.  Neurological:     General: No focal deficit present.     Mental Status: He is alert.   Review of Systems  Respiratory:  Negative for cough and shortness of breath.   Cardiovascular:  Negative for chest pain.  Gastrointestinal:  Negative for abdominal pain, constipation, diarrhea, nausea and vomiting.  Neurological:  Negative for weakness and headaches.  Psychiatric/Behavioral:  Negative for depression, hallucinations and suicidal ideas. The patient is not nervous/anxious.   Blood pressure 117/73, pulse 71, temperature 99 F (37.2 C), temperature source Oral, resp. rate 16, height 5\' 8"  (1.727 m), weight 94.8 kg, SpO2 97 %. Body mass index is 31.78 kg/m.   Treatment Plan Summary: Daily contact with patient to assess and evaluate symptoms and progress in treatment  Mr. Olesen is a 65 yr old male with a PPHx of Depression and PTSD who was admitted for SI.   He has improved as  he was able to get a full nights sleep.  As his sleep has  improved we will keep his Seroquel at its current dose.  We will increase his Effexor tomorrow.  We will continue to monitor.   MDD, Recurrent, Severe w/out psychosis  PTSD -Increase Effexor XR from 37.5 mg to 75 mg daily  -Continue Seroquel 50 mg QHS     Hypokalemia: -One dose of Kdur 40 mEq -Recheck BMP tomorrow AM     Elevated WBC: -Currently afebrile, not tachycardic -Recheck CBC tomorrow AM     Hypothyroidism: -Continue Synthroid 112 mcg daily     HTN: -Continue Amlodipine 5 mg daily -Continue HZTC 25 mg daily     -Continue Aspirin 81 mg daily -Continue Atorvastatin 40 mg QHS -Continue Mag-Ox 400 mg QHS -Continue PRN's: Tylenol, Maalox, Atarax, Milk of Magnesia   Briant Cedar, MD 03/02/2021, 11:50 AM

## 2021-03-02 NOTE — Group Note (Signed)
LCSW Group Therapy Note  Group Date: 03/02/2021 Start Time: 1100 End Time: 1200   Type of Therapy and Topic:  Group Therapy: Anger Cues and Responses  Participation Level:  Minimal   Description of Group:   In this group, patients learned how to recognize the physical, cognitive, emotional, and behavioral responses they have to anger-provoking situations.  They identified a recent time they became angry and how they reacted.  They analyzed how their reaction was possibly beneficial and how it was possibly unhelpful.  The group discussed a variety of healthier coping skills that could help with such a situation in the future.  Focus was placed on how helpful it is to recognize the underlying emotions to our anger, because working on those can lead to a more permanent solution as well as our ability to focus on the important rather than the urgent.  Therapeutic Goals: Patients will remember their last incident of anger and how they felt emotionally and physically, what their thoughts were at the time, and how they behaved. Patients will identify how their behavior at that time worked for them, as well as how it worked against them. Patients will explore possible new behaviors to use in future anger situations. Patients will learn that anger itself is normal and cannot be eliminated, and that healthier reactions can assist with resolving conflict rather than worsening situations.  Summary of Patient Progress:  Group was supplemented with worksheets due to limited staffing.   Therapeutic Modalities:   Cognitive Behavioral Therapy    Vassie Moselle, LCSW 03/02/2021  11:43 AM

## 2021-03-02 NOTE — [facility] Suicide Risk Assessment (Signed)
[facility] INPATIENT:  Family/Significant Other Suicide Prevention Education  Suicide Prevention Education:  Contact Attempts: Alec [name] (brother) @ [phone], has been identified by the patient as the family member/significant other with whom the patient will be residing, and identified as the person(s) who will aid the patient in the event of a mental health crisis.  With written consent from the patient, two attempts were made to provide suicide prevention education, prior to and/or following the patient's discharge.  We were unsuccessful in providing suicide prevention education.  A suicide education pamphlet was given to the patient to share with family/significant other.  Date and time of first attempt: 03/01/2021 @ 4:05 PM Date and time of second attempt: 03/02/2021 @ 10:13 AM. CSW left HIPAA compliant voicemail requesting return call.   [name] A Benay Pomeroy 03/02/2021, 10:13 AM

## 2021-03-02 NOTE — [facility] Group Notes (Signed)
The focus of this group is to help patients establish daily goals to achieve during treatment and discuss how the patient can incorporate goal setting into their daily lives to aide in recovery. 

## 2021-03-03 ENCOUNTER — Encounter (HOSPITAL_COMMUNITY): Payer: Self-pay

## 2021-03-03 DIAGNOSIS — F332 Major depressive disorder, recurrent severe without psychotic features: Principal | ICD-10-CM

## 2021-03-03 LAB — CBC WITH DIFFERENTIAL/PLATELET
Abs Immature Granulocytes: 0.05 10*3/uL (ref 0.00–0.07)
Basophils Absolute: 0 10*3/uL (ref 0.0–0.1)
Basophils Relative: 0 %
Eosinophils Absolute: 0.2 10*3/uL (ref 0.0–0.5)
Eosinophils Relative: 3 %
HCT: 44.4 % (ref 39.0–52.0)
Hemoglobin: 14.3 g/dL (ref 13.0–17.0)
Immature Granulocytes: 1 %
Lymphocytes Relative: 19 %
Lymphs Abs: 1.4 10*3/uL (ref 0.7–4.0)
MCH: 28.5 pg (ref 26.0–34.0)
MCHC: 32.2 g/dL (ref 30.0–36.0)
MCV: 88.4 fL (ref 80.0–100.0)
Monocytes Absolute: 0.7 10*3/uL (ref 0.1–1.0)
Monocytes Relative: 9 %
Neutro Abs: 5.1 10*3/uL (ref 1.7–7.7)
Neutrophils Relative %: 68 %
Platelets: 194 10*3/uL (ref 150–400)
RBC: 5.02 MIL/uL (ref 4.22–5.81)
RDW: 12.9 % (ref 11.5–15.5)
WBC: 7.4 10*3/uL (ref 4.0–10.5)
nRBC: 0 % (ref 0.0–0.2)

## 2021-03-03 LAB — PROLACTIN: Prolactin: 4.4 ng/mL (ref 4.0–15.2)

## 2021-03-03 LAB — BASIC METABOLIC PANEL
Anion gap: 7 (ref 5–15)
BUN: 18 mg/dL (ref 8–23)
CO2: 33 mmol/L — ABNORMAL HIGH (ref 22–32)
Calcium: 8.8 mg/dL — ABNORMAL LOW (ref 8.9–10.3)
Chloride: 99 mmol/L (ref 98–111)
Creatinine, Ser: 0.86 mg/dL (ref 0.61–1.24)
GFR, Estimated: >60 mL/min (ref >60)
Glucose, Bld: 117 mg/dL — ABNORMAL HIGH (ref 70–99)
Potassium: 3.2 mmol/L — ABNORMAL LOW (ref 3.5–5.1)
Sodium: 139 mmol/L (ref 135–145)

## 2021-03-03 MED ORDER — PROPRANOLOL HCL 10 MG PO TABS
5.0000 mg | ORAL_TABLET | Freq: Once | ORAL | Status: AC
  Administered 2021-03-03: 5 mg via ORAL
  Filled 2021-03-03: qty 1
  Filled 2021-03-03: qty 0.5

## 2021-03-03 MED ORDER — PROPRANOLOL HCL 10 MG PO TABS
10.0000 mg | ORAL_TABLET | Freq: Two times a day (BID) | ORAL | Status: DC
  Administered 2021-03-03 – 2021-03-04 (×2): 10 mg via ORAL
  Filled 2021-03-03 (×6): qty 1

## 2021-03-03 MED ORDER — POTASSIUM CHLORIDE CRYS ER 20 MEQ PO TBCR
40.0000 meq | EXTENDED_RELEASE_TABLET | Freq: Two times a day (BID) | ORAL | Status: AC
  Administered 2021-03-03 (×2): 40 meq via ORAL
  Filled 2021-03-03 (×2): qty 2

## 2021-03-03 NOTE — BH IP Treatment Plan (Signed)
Interdisciplinary Treatment and Diagnostic Plan Update  03/03/2021 Time of Session: 10:05am Alec [name] MRN: [id number]  Principal Diagnosis: MDD (major depressive disorder), recurrent severe, without psychosis ([place])  Secondary Diagnoses: Principal Problem:   MDD (major depressive disorder), recurrent severe, without psychosis ([place]) Active Problems:   Anxiety disorder, unspecified   Current Medications:  Current Facility-Administered Medications  Medication Dose Route Frequency Provider Last Rate Last Admin   acetaminophen (TYLENOL) tablet 650 mg  650 mg Oral Q6H PRN Lucky Rathke, FNP   650 mg at 03/03/21 0808   alum & mag hydroxide-simeth (MAALOX/MYLANTA) 200-200-20 MG/5ML suspension 30 mL  30 mL Oral Q4H PRN Lucky Rathke, FNP       amLODipine (NORVASC) tablet 5 mg  5 mg Oral Daily Lucky Rathke, FNP   5 mg at 03/03/21 5809   aspirin EC tablet 81 mg  81 mg Oral Daily Lucky Rathke, FNP   81 mg at 03/03/21 0806   atorvastatin (LIPITOR) tablet 40 mg  40 mg Oral QHS Bobbitt, Shalon E, NP   40 mg at 03/02/21 2113   hydrochlorothiazide (HYDRODIURIL) tablet 25 mg  25 mg Oral Daily Lucky Rathke, FNP   25 mg at 03/03/21 0806   levothyroxine (SYNTHROID) tablet 112 mcg  112 mcg Oral Q0600 Lucky Rathke, FNP   112 mcg at 03/03/21 0610   magnesium hydroxide (MILK OF MAGNESIA) suspension 30 mL  30 mL Oral Daily PRN Lucky Rathke, FNP       magnesium oxide (MAG-OX) tablet 400 mg  400 mg Oral QHS Bobbitt, Shalon E, NP   400 mg at 03/02/21 2114   potassium chloride SA (KLOR-CON) CR tablet 40 mEq  40 mEq Oral BID Briant Cedar, MD       QUEtiapine (SEROQUEL) tablet 50 mg  50 mg Oral QHS Briant Cedar, MD   50 mg at 03/02/21 2114   venlafaxine XR (EFFEXOR-XR) 24 hr capsule 75 mg  75 mg Oral Q breakfast Briant Cedar, MD   75 mg at 03/03/21 0806   PTA Medications: Medications Prior to Admission  Medication Sig Dispense Refill Last Dose   amLODipine (NORVASC) 5 MG tablet  Take 1 tablet (5 mg total) by mouth daily. 90 tablet 3    aspirin 81 MG EC tablet Take 81 mg by mouth daily. Swallow whole.      atorvastatin (LIPITOR) 40 MG tablet Take 1 tablet (40 mg total) by mouth daily. 90 tablet 3    buPROPion (WELLBUTRIN XL) 300 MG 24 hr tablet Take 300 mg by mouth daily.      hydrochlorothiazide (HYDRODIURIL) 25 MG tablet Take 1 tablet (25 mg total) by mouth daily. 90 tablet 3    ibuprofen (ADVIL) 200 MG tablet Take 200 mg by mouth in the morning and at bedtime.      levothyroxine (SYNTHROID) 112 MCG tablet TAKE 1 TABLET (112 MCG TOTAL) BY MOUTH DAILY BEFORE BREAKFAST. 90 tablet 0    MAGNESIUM PO Take 1 tablet by mouth daily.      naproxen sodium (ALEVE) 220 MG tablet Take 220 mg by mouth in the morning and at bedtime.      Omega-3 Fatty Acids (FISH OIL PO) Take 1 capsule by mouth daily.       Patient Stressors: Financial difficulties   Health problems   Marital or family conflict   Medication change or noncompliance   Traumatic event    Patient Strengths: Ability for insight  Capable of independent living  Communication skills  General fund of knowledge  Motivation for treatment/growth  Work skills   Treatment Modalities: Medication Management, Group therapy, Case management,  1 to 1 session with clinician, Psychoeducation, Recreational therapy.   Physician Treatment Plan for Primary Diagnosis: MDD (major depressive disorder), recurrent severe, without psychosis ([place]) Long Term Goal(s):     Short Term Goals:    Medication Management: Evaluate patient's response, side effects, and tolerance of medication regimen.  Therapeutic Interventions: 1 to 1 sessions, Unit Group sessions and Medication administration.  Evaluation of Outcomes: Progressing  Physician Treatment Plan for Secondary Diagnosis: Principal Problem:   MDD (major depressive disorder), recurrent severe, without psychosis ([place]) Active Problems:   Anxiety disorder, unspecified  Long Term  Goal(s):     Short Term Goals:       Medication Management: Evaluate patient's response, side effects, and tolerance of medication regimen.  Therapeutic Interventions: 1 to 1 sessions, Unit Group sessions and Medication administration.  Evaluation of Outcomes: Progressing   RN Treatment Plan for Primary Diagnosis: MDD (major depressive disorder), recurrent severe, without psychosis ([place]) Long Term Goal(s): Knowledge of disease and therapeutic regimen to maintain health will improve  Short Term Goals: Ability to remain free from injury will improve, Ability to verbalize frustration and anger appropriately will improve, Ability to demonstrate self-control, and Compliance with prescribed medications will improve  Medication Management: RN will administer medications as ordered by provider, will assess and evaluate patient's response and provide education to patient for prescribed medication. RN will report any adverse and/or side effects to prescribing provider.  Therapeutic Interventions: 1 on 1 counseling sessions, Psychoeducation, Medication administration, Evaluate responses to treatment, Monitor vital signs and CBGs as ordered, Perform/monitor CIWA, COWS, AIMS and Fall Risk screenings as ordered, Perform wound care treatments as ordered.  Evaluation of Outcomes: Progressing   LCSW Treatment Plan for Primary Diagnosis: MDD (major depressive disorder), recurrent severe, without psychosis ([place]) Long Term Goal(s): Safe transition to appropriate next level of care at discharge, Engage patient in therapeutic group addressing interpersonal concerns.  Short Term Goals: Engage patient in aftercare planning with referrals and resources, Increase social support, Increase ability to appropriately verbalize feelings, and Increase skills for wellness and recovery  Therapeutic Interventions: Assess for all discharge needs, 1 to 1 time with Social worker, Explore available resources and support  systems, Assess for adequacy in community support network, Educate family and significant other(s) on suicide prevention, Complete Psychosocial Assessment, Interpersonal group therapy.  Evaluation of Outcomes: Progressing   Progress in Treatment: Attending groups: Yes. Participating in groups: Yes. Taking medication as prescribed: Yes. Toleration medication: Yes. Family/Significant other contact made: No, will contact:  attempted to reach pt's brother Patient understands diagnosis: Yes. Discussing patient identified problems/goals with staff: Yes. Medical problems stabilized or resolved: Yes. Denies suicidal/homicidal ideation: Yes. Issues/concerns per patient self-inventory: No.  New problem(s) identified: No, Describe:  none  New Short Term/Long Term Goal(s): medication stabilization, elimination of SI thoughts, development of comprehensive mental wellness plan.    Patient Goals:  "Be productive; med changes"  Discharge Plan or Barriers: Pt is to return home with mother at discharge. Pt is to follow up with established therapist and psychiatrist.   Reason for Continuation of Hospitalization: Medication stabilization  Estimated Length of Stay: 1-3 days   Scribe for Treatment Team: Vassie Moselle, LCSW 03/03/2021 11:35 AM

## 2021-03-03 NOTE — Progress Notes (Addendum)
   03/03/21 1100  Psych Admission Type (Psych Patients Only)  Admission Status Voluntary  Psychosocial Assessment  Patient Complaints None  Eye Contact Brief  Facial Expression Flat  Affect Appropriate to circumstance  Speech Logical/coherent  Interaction Assertive  Motor Activity Slow  Appearance/Hygiene Unremarkable  Behavior Characteristics Cooperative;Appropriate to situation  Mood Pleasant  Thought Process  Coherency WDL  Content WDL  Delusions None reported or observed  Perception WDL  Hallucination None reported or observed  Judgment Impaired  Confusion None  Danger to Self  Current suicidal ideation? Denies  Danger to Others  Danger to Others None reported or observed    D. Pt has been friendly during interactions- pt reported improved mood, despite having had poor sleep due to his room mate's snoring. Pt has been visible in the dayroom attending groups. Pt rated his depression, hopelessness and anxiety a 1/0/0, respectively. Pt currently denies SI/HI and AVH  A. Labs and vitals monitored. Pt given and educated on medications. Pt supported emotionally and encouraged to express concerns and ask questions.   R. Pt remains safe with 15 minute checks. Will continue POC.

## 2021-03-03 NOTE — Group Note (Signed)
Recreation Therapy Group Note   Group Topic:Stress Management  Group Date: 03/03/2021 Start Time: 9021 End Time: 0957 Facilitators: Victorino Sparrow, LRT/CTRS Location: [address] Dayroom  Goal Area(s) Addresses:  Patient will actively participate in stress management techniques presented during session.  Patient will successfully identify benefit of practicing stress management post d/c.   Group Description: Guided Imagery. LRT provided education, instruction, and demonstration on practice of visualization via guided imagery. Patient was asked to participate in the technique introduced during session. LRT debriefed including topics of mindfulness, stress management and specific scenarios each patient could use these techniques. Patients were given suggestions of ways to access scripts post d/c and encouraged to explore Youtube and other apps available on smartphones, tablets, and computers.   Affect/Mood: Appropriate   Participation Level: Engaged   Participation Quality: Independent   Behavior: Appropriate   Speech/Thought Process: Focused   Insight: Good   Judgement: Good   Modes of Intervention: [employer] and Script   Patient Response to Interventions:  Attentive and Engaged   Education Outcome:  Acknowledges education and In group clarification offered    Clinical Observations/Individualized Feedback: Pt actively participated in group.    Plan: Continue to engage patient in RT group sessions 2-3x/week.   Victorino Sparrow, LRT/CTRS 03/03/2021 12:32 PM

## 2021-03-03 NOTE — BHH Group Notes (Signed)
[facility] Group Notes:  (Nursing/MHT/Case Management/Adjunct)  Date:  03/03/2021  Time: 4:00 pm  Type of Therapy:  Psychoeducational Skills  Participation Level:  Active  Participation Quality:  Appropriate  Affect:  Appropriate  Cognitive:  Appropriate  Insight:  Appropriate  Engagement in Group:  Engaged  Modes of Intervention:  Education  Summary of Progress/Problems: Educated patient on occupational wellness. Explained that occupational well is tied to ones work and [employer]. This type of wellness is tied to personal satisfaction and enrichment in ones life. Patient was engaged throughout group.  Jerrye Beavers 03/03/2021, 6:21 PM

## 2021-03-03 NOTE — Progress Notes (Signed)
Pt did not attend the evening AA group. 

## 2021-03-03 NOTE — Progress Notes (Signed)
[facility] MD Progress Note  03/03/2021 7:16 AM DARIAN CANSLER  MRN:  [id number] Subjective:   Mr. Leeds is a 65 yr old male with a PPHx of Depression and PTSD who was admitted for SI.   Psychiatric team made the following recommendations yesterday: -Increase Effexor XR from 37.5 mg to 75 mg daily  -Continue Seroquel 50 mg QHS -One dose of Kdur   Case was discussed in the multidisciplinary team. MAR was reviewed and patient was compliant with medications.   On interview today patient reports he is doing well.  He states that he is sleep because doing good.  He reports that his appetite is good.  He reports no SI, HI, or AVH.  He states he is feeling a little less nervous about being here because his brothers have started to help with their mother.  He reports that one of his brothers spent the night at the house and that they are checking in on her.  He reports that he still worries since moans there 24/7 but he is more reassured.  He states that he was able to sleep well last night.  He reports no side effects from his medications.  He reports that he does have some shakiness in his hands that he has had there for decades.  He reports that this does not interfere with his ability to eat.  Discussed starting Inderal to help with essential tremor.  He was agreeable to starting this.  Discussed with him that his potassium was still low on recheck this morning.  Told him the plan was for additional supplementation plus potassium today with a redraw of the BMP tomorrow to check for the results.  Discussed that if his potassium continues to be low he would need to follow-up with this PCP as HTCZ causes potassium loss and he may need to change blood pressure medications.  He reported understanding.  He has no other concerns at present.   Principal Problem: MDD (major depressive disorder), recurrent severe, without psychosis ([place]) Diagnosis: Principal Problem:   MDD (major depressive disorder),  recurrent severe, without psychosis ([place]) Active Problems:   Anxiety disorder, unspecified  Total Time spent with patient: 30 minutes  Past Psychiatric History: Depression and PTSD  Past Medical History:  Past Medical History:  Diagnosis Date   Depression    sees psychiatrist at behavioral health   Hyperlipidemia    Hypertension    Hypothyroidism    Stroke [facility]) 2004   acute thalamic infarct, started on aspirin at that time    Past Surgical History:  Procedure Laterality Date   BRANCHIAL CLEFT CYST EXCISION     Family History:  Family History  Problem Relation Age of Onset   Stroke Father    Family Psychiatric  History: Father- admitted to [facility] once (most likely PTSD) Brother- paranoid schizophrenia Social History:  Social History   Substance and Sexual Activity  Alcohol Use No     Social History   Substance and Sexual Activity  Drug Use No    Social History   Socioeconomic History   Marital status: Divorced    Spouse name: Not on file   Number of children: Not on file   Years of education: Not on file   Highest education level: Not on file  Occupational History   Not on file  Tobacco Use   Smoking status: Former    Types: Cigarettes    Quit date: 05/25/1994    Years since quitting: 8.7  Smokeless tobacco: Never  Substance and Sexual Activity   Alcohol use: No   Drug use: No   Sexual activity: Not Currently  Other Topics Concern   Not on file  Social History Narrative   Not on file   Social Determinants of Health   Financial Resource Strain: Not on file  Food Insecurity: Not on file  Transportation Needs: Not on file  Physical Activity: Not on file  Stress: Not on file  Social Connections: Not on file   Additional Social History:                         Sleep: Fair  Appetite:  Good  Current Medications: Current Facility-Administered Medications  Medication Dose Route Frequency Provider Last Rate Last Admin    acetaminophen (TYLENOL) tablet 650 mg  650 mg Oral Q6H PRN Lucky Rathke, FNP   650 mg at 03/01/21 1827   alum & mag hydroxide-simeth (MAALOX/MYLANTA) 200-200-20 MG/5ML suspension 30 mL  30 mL Oral Q4H PRN Lucky Rathke, FNP       amLODipine (NORVASC) tablet 5 mg  5 mg Oral Daily Lucky Rathke, FNP   5 mg at 03/02/21 0816   aspirin EC tablet 81 mg  81 mg Oral Daily Lucky Rathke, FNP   81 mg at 03/02/21 0816   atorvastatin (LIPITOR) tablet 40 mg  40 mg Oral QHS Bobbitt, Shalon E, NP   40 mg at 03/02/21 2113   hydrochlorothiazide (HYDRODIURIL) tablet 25 mg  25 mg Oral Daily Lucky Rathke, FNP   25 mg at 03/02/21 0816   levothyroxine (SYNTHROID) tablet 112 mcg  112 mcg Oral Q0600 Lucky Rathke, FNP   112 mcg at 03/03/21 0610   magnesium hydroxide (MILK OF MAGNESIA) suspension 30 mL  30 mL Oral Daily PRN Lucky Rathke, FNP       magnesium oxide (MAG-OX) tablet 400 mg  400 mg Oral QHS Bobbitt, Shalon E, NP   400 mg at 03/02/21 2114   QUEtiapine (SEROQUEL) tablet 50 mg  50 mg Oral QHS Briant Cedar, MD   50 mg at 03/02/21 2114   venlafaxine XR (EFFEXOR-XR) 24 hr capsule 75 mg  75 mg Oral Q breakfast Briant Cedar, MD        Lab Results:  Results for orders placed or performed during the hospital encounter of 02/28/21 (from the past 48 hour(s))  CBC with Differential/Platelet     Status: None   Collection Time: 03/03/21  6:13 AM  Result Value Ref Range   WBC 7.4 4.0 - 10.5 K/uL   RBC 5.02 4.22 - 5.81 MIL/uL   Hemoglobin 14.3 13.0 - 17.0 g/dL   HCT 44.4 39.0 - 52.0 %   MCV 88.4 80.0 - 100.0 fL   MCH 28.5 26.0 - 34.0 pg   MCHC 32.2 30.0 - 36.0 g/dL   RDW 12.9 11.5 - 15.5 %   Platelets 194 150 - 400 K/uL   nRBC 0.0 0.0 - 0.2 %   Neutrophils Relative % 68 %   Neutro Abs 5.1 1.7 - 7.7 K/uL   Lymphocytes Relative 19 %   Lymphs Abs 1.4 0.7 - 4.0 K/uL   Monocytes Relative 9 %   Monocytes Absolute 0.7 0.1 - 1.0 K/uL   Eosinophils Relative 3 %   Eosinophils Absolute 0.2 0.0 - 0.5  K/uL   Basophils Relative 0 %   Basophils Absolute 0.0 0.0 - 0.1 K/uL  Immature Granulocytes 1 %   Abs Immature Granulocytes 0.05 0.00 - 0.07 K/uL    Comment: Performed at [facility], [place] [address] [place], [place] [address]    Blood Alcohol level:  Lab Results  Component Value Date   ETH <10 75/17/0017    Metabolic Disorder Labs: Lab Results  Component Value Date   HGBA1C 6.0 (H) 02/28/2021   MPG 125.5 02/28/2021   Lab Results  Component Value Date   PROLACTIN 4.4 02/28/2021   Lab Results  Component Value Date   CHOL 155 02/28/2021   TRIG 81 02/28/2021   HDL 51 02/28/2021   CHOLHDL 3.0 02/28/2021   VLDL 16 02/28/2021   LDLCALC 88 02/28/2021   LDLCALC 90 08/28/2016    Physical Findings:  Musculoskeletal: Strength & Muscle Tone: within normal limits Gait & Station: normal Patient leans: N/A  Psychiatric Specialty Exam:  Presentation  General Appearance: Appropriate for Environment; Casual; Fairly Groomed  Eye Contact:Good  Speech:Clear and Coherent; Normal Rate  Speech Volume:Normal  Handedness:Right   Mood and Affect  Mood:Euthymic  Affect:Appropriate; Congruent   Thought Process  Thought Processes:Coherent; Goal Directed  Descriptions of Associations:Intact  Orientation:Full (Time, Place and Person)  Thought Content:Logical; WDL  History of Schizophrenia/Schizoaffective disorder:No data recorded Duration of Psychotic Symptoms:No data recorded Hallucinations:Hallucinations: None Ideas of Reference:None  Suicidal Thoughts:Suicidal Thoughts: No Homicidal Thoughts:Homicidal Thoughts: No  Sensorium  Memory:Immediate Good; Recent Good  Judgment:Good  Insight:Good   Executive Functions  Concentration:Good  Attention Span:Good  [place] of Knowledge:Good  Language:Good   Psychomotor Activity  Psychomotor Activity: Psychomotor Activity: Normal  Assets  Assets:Communication Skills; Desire  for Improvement; Resilience   Sleep  Sleep: Sleep: Good Number of Hours of Sleep: 6.75   Physical Exam: Physical Exam Vitals and nursing note reviewed.  Constitutional:      General: He is not in acute distress.    Appearance: Normal appearance. He is normal weight. He is not ill-appearing or toxic-appearing.  HENT:     Head: Normocephalic and atraumatic.  Pulmonary:     Effort: Pulmonary effort is normal.  Musculoskeletal:        General: Normal range of motion.     Comments: Postural and intentional tremor noted No resting tremor noted No facies noted  Neurological:     General: No focal deficit present.     Mental Status: He is alert.   Review of Systems  Respiratory:  Negative for cough and shortness of breath.   Cardiovascular:  Negative for chest pain.  Gastrointestinal:  Negative for abdominal pain, constipation, diarrhea, nausea and vomiting.  Neurological:  Negative for weakness and headaches.  Blood pressure 135/78, pulse 69, temperature 97.9 F (36.6 C), temperature source Oral, resp. rate 18, height 5\' 8"  (1.727 m), weight 94.8 kg, SpO2 100 %. Body mass index is 31.78 kg/m.   Treatment Plan Summary: Daily contact with patient to assess and evaluate symptoms and progress in treatment  Mr. Schoch is a 65 yr old male with a PPHx of Depression and PTSD who was admitted for SI.   He is stable from a psychiatric standpoint and able to get sleep.  He does appear to have an essential tremor so we will start Inderal for him.  He is still hypokalemic so we will order another 2 doses of K-Dur and recheck a BMP tomorrow morning.  If he continues to have issues we will have him follow-up with his PCP on discharge to discuss potentially stopping his  hydrochlorothiazide.  We will plan for discharge tomorrow.  We will continue to monitor.   MDD, Recurrent, Severe w/out psychosis  PTSD -Increase Effexor XR from 37.5 mg to 75 mg daily  -Continue Seroquel 50 mg QHS      Hypokalemia: -K was still low at 3.2 -2 doses of Kdur 40 mEq  -Repeat BMP tomorrow AM     Elevated WBC(resolved): -Currently afebrile, not tachycardic -WBC is 7.8 today     Hypothyroidism: -Continue Synthroid 112 mcg daily     HTN: -Continue Amlodipine 5 mg daily -Continue HZTC 25 mg daily     -Continue Aspirin 81 mg daily -Continue Atorvastatin 40 mg QHS -Continue Mag-Ox 400 mg QHS -Continue PRN's: Tylenol, Maalox, Atarax, Milk of Magnesia   Briant Cedar, MD 03/03/2021, 7:16 AM

## 2021-03-03 NOTE — Progress Notes (Signed)
   03/03/21 0019  Psych Admission Type (Psych Patients Only)  Admission Status Voluntary  Psychosocial Assessment  Patient Complaints None  Eye Contact Brief  Facial Expression Flat  Affect Appropriate to circumstance  Speech Logical/coherent  Interaction Assertive  Motor Activity Slow  Appearance/Hygiene Unremarkable  Behavior Characteristics Appropriate to situation  Mood Pleasant  Thought Process  Coherency WDL  Content WDL  Delusions None reported or observed  Perception WDL  Hallucination None reported or observed  Judgment Impaired  Confusion None  Danger to Self  Current suicidal ideation? Denies  Danger to Others  Danger to Others None reported or observed

## 2021-03-03 NOTE — BHH Group Notes (Signed)
[facility] Group Notes:  (Nursing/MHT/Case Management/Adjunct)  Date:  03/03/2021  Time:  9:00 am  Type of Therapy:  Group Therapy  Participation Level:  Active  Participation Quality:  Appropriate  Affect:  Appropriate  Cognitive:  Appropriate  Insight:  Appropriate  Engagement in Group:  Engaged  Modes of Intervention:  Discussion  Summary of Progress/Problems: Patient's  goal today was to work on becoming productive again. Discussed with patient what becoming productive looks like for him . Patient was able to verbalize what he needs to do in order to reach that goals as evidenced by stating ,' I  needs to stop isolating and make a list each day of the things I want to get done."  Jerrye Beavers 03/03/2021, 10:12 AM

## 2021-03-03 NOTE — Group Note (Signed)
LCSW Group Therapy Note  Group Date: 03/03/2021 Start Time: 1300 End Time: 1400   Type of Therapy and Topic:  Group Therapy - How To Cope with Nervousness about Discharge   Participation Level:  Active   Description of Group This process group involved identification of patients' feelings about discharge. Some of them are scheduled to be discharged soon, while others are new admissions, but each of them was asked to share thoughts and feelings surrounding discharge from the hospital. One common theme was that they are excited at the prospect of going home, while another was that many of them are apprehensive about sharing why they were hospitalized. Patients were given the opportunity to discuss these feelings with their peers in preparation for discharge.  Therapeutic Goals  Patient will identify their overall feelings about pending discharge. Patient will think about how they might proactively address issues that they believe will once again arise once they get home (i.e. with parents). Patients will participate in discussion about having hope for change.   Summary of Patient Progress:  Alec [name] stated that he would like to begin walking more after discharge. Pt demonstrated insight into the subject matter, and proved open to input from peers and feedback from [place]. Pt was respectful of peers and participated throughout the entire session.   Therapeutic Modalities Cognitive Behavioral Therapy   Darleen Crocker, Latanya Presser 03/03/2021  1:55 PM

## 2021-03-03 NOTE — Group Note (Signed)
Occupational Therapy Group Note  Group Topic:Self-Esteem  Group Date: 03/03/2021 Start Time: 1400 End Time: 1450 Facilitators: Ponciano Ort, OT/L   Group Description: Group encouraged increased engagement and participation through discussion and activity focused on self-esteem. Patients explored and discussed the differences between healthy and low self-esteem and how it affects our daily lives and occupations with a focus on relationships, work, school, self-care, and personal leisure interests. Group discussion then transitioned into identifying specific strategies to boost self-esteem and engaged in a collaborative and independent activity looking at positive ways to describe oneself A-Z.   Therapeutic Goal(s): Understand and recognize the differences between healthy and low self-esteem Identify healthy strategies to improve/build self-esteem    Participation Level: Active   Participation Quality: Independent   Behavior: Calm, Cooperative, and Interactive    Speech/Thought Process: Focused   Affect/Mood: Euthymic   Insight: Fair   Judgement: Fair   Individualization: Alec [name] was active in their participation of group discussion/activity. Appeared receptive to activity and education around additional strategies to boost self-worth. Shared in group "Instead of writing down ways to describe myself, I came up with sayings to tell myself because that helps me more"  Modes of Intervention: Activity, Discussion, and Education  Patient Response to Interventions:  Attentive, Engaged, Interested , and Receptive   Plan: Continue to engage patient in OT groups 2 - 3x/week.  03/03/2021  Ponciano Ort, OT/L

## 2021-03-04 LAB — BASIC METABOLIC PANEL
Anion gap: 3 — ABNORMAL LOW (ref 5–15)
BUN: 24 mg/dL — ABNORMAL HIGH (ref 8–23)
CO2: 34 mmol/L — ABNORMAL HIGH (ref 22–32)
Calcium: 8.7 mg/dL — ABNORMAL LOW (ref 8.9–10.3)
Chloride: 102 mmol/L (ref 98–111)
Creatinine, Ser: 0.92 mg/dL (ref 0.61–1.24)
GFR, Estimated: >60 mL/min (ref >60)
Glucose, Bld: 131 mg/dL — ABNORMAL HIGH (ref 70–99)
Potassium: 3.9 mmol/L (ref 3.5–5.1)
Sodium: 139 mmol/L (ref 135–145)

## 2021-03-04 MED ORDER — MAGNESIUM OXIDE -MG SUPPLEMENT 400 (240 MG) MG PO TABS: 400.0000 mg | ORAL_TABLET | Freq: Every day | ORAL | 0 refills | Status: AC

## 2021-03-04 MED ORDER — QUETIAPINE FUMARATE 50 MG PO TABS: 50.0000 mg | ORAL_TABLET | Freq: Every day | ORAL | 0 refills | Status: AC

## 2021-03-04 MED ORDER — PROPRANOLOL HCL 10 MG PO TABS: 10.0000 mg | ORAL_TABLET | Freq: Two times a day (BID) | ORAL | 0 refills | Status: AC

## 2021-03-04 MED ORDER — VENLAFAXINE HCL ER 75 MG PO CP24: 75.0000 mg | ORAL_CAPSULE | Freq: Every day | ORAL | 0 refills | Status: AC

## 2021-03-04 NOTE — Progress Notes (Signed)
D: Patient scheduled for discharge today. He denies any depression, hopelessness, and anxiety. His goal today is "returning to a productive life." He denies any SI/HI/AVH.   A: Continue to monitor medication management and MD orders.  Safety checks completed every 15 minutes per protocol.  Offer support and encouragement as needed.  R: Patient is receptive to staff; his behavior is appropriate.     03/04/21 1100  Psych Admission Type (Psych Patients Only)  Admission Status Voluntary  Psychosocial Assessment  Patient Complaints None  Eye Contact Fair;Brief  Facial Expression Animated  Affect Appropriate to circumstance  Speech Logical/coherent  Interaction Assertive  Motor Activity Slow  Appearance/Hygiene Unremarkable  Behavior Characteristics Cooperative  Mood Pleasant  Thought Process  Coherency WDL  Content WDL  Delusions None reported or observed  Perception WDL  Hallucination None reported or observed  Judgment Impaired  Confusion None  Danger to Self  Current suicidal ideation? Denies  Danger to Others  Danger to Others None reported or observed
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
## 2021-03-04 NOTE — Progress Notes (Signed)
Patient ID: Alec [name], male   DOB: Sep 06, 1955, 65 y.o.   MRN: [id number] Patient discharged at 1235. Patient was missing $20 from his initial $300 upon admission. Money was counted 3 times with patient and staff. He was given the number to Patient Experience. It should be noted that he states $100 was taken from him "at the other place." Notified [facility] of same. Patient left at 1235 with Safe Transport.

## 2021-03-04 NOTE — [facility] Group Notes (Signed)
Adult Psychoeducational Group Note  Date:  03/04/2021 Time:  10:34 AM  Group Topic/Focus:  Goals Group:   The focus of this group is to help patients establish daily goals to achieve during treatment and discuss how the patient can incorporate goal setting into their daily lives to aide in recovery.  Participation Level:  Active  Participation Quality:  Appropriate and Attentive  Affect:  Appropriate  Cognitive:  Alert and Appropriate  Insight: Appropriate and Good  Engagement in Group:  Engaged  Modes of Intervention:  Education  Additional Comments:  Pt attended and participated in the morning goal group. Pt had a goal today of figuring out how to be functional in life again at home. Also pt says they will cooperate with all staff that try and help.    Alec [name] 03/04/2021, 10:34 AM

## 2021-03-04 NOTE — [facility] Suicide Risk Assessment (Addendum)
[facility] Discharge Suicide Risk Assessment   Principal Problem: MDD (major depressive disorder), recurrent severe, without psychosis ([place]) Discharge Diagnoses: Principal Problem:   MDD (major depressive disorder), recurrent severe, without psychosis ([name]) Active Problems:   Anxiety disorder, unspecified  Mr. Vipond is a 65 yr old male with a PPHx of Depression and PTSD who was admitted for SI.  During the patient's hospitalization, patient had extensive initial psychiatric evaluation, and follow-up psychiatric evaluations every day. Patient was started on effexor for mood and anxiety, and Seroquel for mood and sleep. Patient's care was discussed during the interdisciplinary team meeting every day during the hospitalization. The patient denies having side effects to prescribed psychiatric medication. The patient reports their target psychiatric symptoms of depression, anxiety, suicidal thoughts all responded well to the psychiatric medications, and the patient reports overall benefit other psychiatric hospitalization.  Labs were reviewed with the patient, and abnormal results were discussed with the patient. -Low potassium, low calcium, elevated A1c.  The patient denied having suicidal thoughts more than 48 hours prior to discharge.  Patient denies having homicidal thoughts.  Patient denies having auditory hallucinations.  Patient denies any visual hallucinations.  Patient denies having paranoid thoughts.  The patient is able to verbalize their individual safety plan to this provider. It is recommended to the patient to continue psychiatric medications as prescribed, after discharge from the hospital.    It is recommended to the patient to follow up with their outpatient psychiatric provider and PCP.  Discussed with the patient, the impact of alcohol, drugs, tobacco have been there overall psychiatric and medical wellbeing, and abstaining from substance use was recommended the patient.  Total  Time spent with patient: 20 minutes  Musculoskeletal: Strength & Muscle Tone: within normal limits Gait & Station: normal Patient leans: N/A  Psychiatric Specialty Exam  Presentation  General Appearance: Appropriate for Environment; Casual; Fairly Groomed; Neat  Eye Contact:Good  Speech:Clear and Coherent; Normal Rate  Speech Volume:Normal  Handedness:Right   Mood and Affect  Mood:Euthymic  Duration of Depression Symptoms: No data recorded Affect:Appropriate; Congruent; Full Range   Thought Process  Thought Processes:Coherent; Linear  Descriptions of Associations:Intact  Orientation:Full (Time, Place and Person)  Thought Content:Logical  History of Schizophrenia/Schizoaffective disorder:No data recorded Duration of Psychotic Symptoms:No data recorded Hallucinations:Hallucinations: None  Ideas of Reference:None  Suicidal Thoughts:Suicidal Thoughts: No  Homicidal Thoughts:Homicidal Thoughts: No   Sensorium  Memory:Immediate Good; Recent Good; Remote Good  Judgment:Good  Insight:Good   Executive Functions  Concentration:Good  Attention Span:Good  [place] of Knowledge:Good  Language:Good   Psychomotor Activity  Psychomotor Activity:Psychomotor Activity: Normal   Assets  Assets:Communication Skills; Desire for Improvement; Resilience   Sleep  Sleep:Sleep: Good Number of Hours of Sleep: 5   Physical Exam: Physical Exam see H&P ROS see H&P Blood pressure 138/75, pulse 65, temperature 97.9 F (36.6 C), temperature source Oral, resp. rate 14, height 5\' 8"  (1.727 m), weight 94.8 kg, SpO2 97 %. Body mass index is 31.78 kg/m.  Mental Status Per Nursing Assessment::   On Admission:  NA  Demographic Factors:  Male, Age 41 or older, Divorced or widowed, Caucasian, and Access to firearms  Loss Factors: NA  Historical Factors: overwhelmed as a caregiver  Risk Reduction Factors:   Sense of responsibility to family, Living  with another person, especially a relative, Positive social support, Positive therapeutic relationship, and Positive coping skills or problem solving skills  Continued Clinical Symptoms:  Depression:   Impulsivity  Cognitive Features That Contribute To  Risk:  None    Suicide Risk:  Mild:  There are no identifiable suicide plans, no associated intent, mild dysphoria and related symptoms, good self-control (both objective and subjective assessment), few other risk factors, and identifiable protective factors, including available and accessible social support.   Follow-up Information     April Forsbrey, Counselor Follow up on 03/06/2021.   Why: You have an appointment for therapy services on Thursday 03/06/21 at 12 pm. Contact information: [address], [place], [place] [address]  P: [phone]  F: [phone]        [place], [place]. Go on 03/21/2021.   Why: You have an appointment for medication management on 03/21/21 at 11:00 am. This appointment will be held in person. Contact information: [place] [address] [phone]                 Plan Of Care/Follow-up recommendations:  Activity: as tolerated  Diet: heart healthy  Other: -Follow-up with your outpatient psychiatric provider -instructions on appointment date, time, and address (location) are provided to you in discharge paperwork. -Take your psychiatric medications as prescribed at discharge - instructions are provided to you in the discharge paperwork -Follow-up with outpatient primary care doctor and other specialists -for management of chronic medical disease, including: Hypertension, hyperlipidemia, tremor -Testing: Follow-up with outpatient provider for abnormal lab results: Low potassium, which resolved.  Low calcium.  Elevated A1c -Recommend abstinence from alcohol, tobacco, and other illicit drug use at discharge.  -If your psychiatric symptoms  recur, worsening, or if you have side effects to their psychiatric medications, call your outpatient psychiatric provider, 911, [phone] or go to the nearest emergency department. -If suicidal thoughts recur, call your outpatient psychiatric provider, 911, [phone] or go to the nearest emergency department.   Christoper Allegra, MD 03/04/2021, 9:28 AM

## 2021-03-04 NOTE — Progress Notes (Signed)
  [facility] Adult Case Management Discharge Plan :  Will you be returning to the same living situation after discharge:  Yes,  Home  At discharge, do you have transportation home?: Yes,  Safe Transport or Brother  Do you have the ability to pay for your medications: Yes,  Medicare   Release of information consent forms completed and in the chart;  Patient's signature needed at discharge.  Patient to Follow up at:  Follow-up Information     April Forsbrey, Counselor Follow up on 03/06/2021.   Why: You have an appointment for therapy services on Thursday 03/06/21 at 12 pm. Contact information: [address], [place], [place] [address]  P: [phone]  F: [phone]        [place], [place]. Go on 03/21/2021.   Why: You have an appointment for medication management on 03/21/21 at 11:00 am. This appointment will be held in person. Contact information: [facility] [place] [address] [phone]                 Next level of care provider has access to [place] and Suicide Prevention discussed: Yes,  with patient      Has patient been referred to the Quitline?: Patient refused referral  Patient has been referred for addiction treatment: N/A  Darleen Crocker, [place] 03/04/2021, 9:52 AM

## 2021-03-05 NOTE — [facility] Group Notes (Signed)
Spiritual care group on grief and loss facilitated by chaplain Janne Napoleon, [facility]   Group Goal:   Support / Education around grief and loss   Members engage in facilitated group support and psycho-social education.   Group Description:   Following introductions and group rules, group members engaged in facilitated group dialog and support around topic of loss, with particular support around experiences of loss in their lives. Group Identified types of loss (relationships / self / things) and identified patterns, circumstances, and changes that precipitate losses. Reflected on thoughts / feelings around loss, normalized grief responses, and recognized variety in grief experience. Group noted Worden's four tasks of grief in discussion.   Group drew on Adlerian / Rogerian, narrative, MI,   Patient Progress:  [place] People attended group and was actively engaged in the conversation and in participation.  [place], Bcc Pager, [phone] 10:00 AM

## 2021-06-16 DIAGNOSIS — R251 Tremor, unspecified: Secondary | ICD-10-CM | POA: Diagnosis not present

## 2021-06-16 DIAGNOSIS — Z0000 Encounter for general adult medical examination without abnormal findings: Secondary | ICD-10-CM | POA: Diagnosis not present

## 2021-06-16 DIAGNOSIS — E7800 Pure hypercholesterolemia, unspecified: Secondary | ICD-10-CM | POA: Diagnosis not present

## 2021-06-16 DIAGNOSIS — I10 Essential (primary) hypertension: Secondary | ICD-10-CM | POA: Diagnosis not present

## 2021-06-16 DIAGNOSIS — E039 Hypothyroidism, unspecified: Secondary | ICD-10-CM | POA: Diagnosis not present

## 2021-12-05 DIAGNOSIS — H2513 Age-related nuclear cataract, bilateral: Secondary | ICD-10-CM | POA: Diagnosis not present

## 2021-12-05 DIAGNOSIS — H5203 Hypermetropia, bilateral: Secondary | ICD-10-CM | POA: Diagnosis not present

## 2021-12-05 DIAGNOSIS — H04123 Dry eye syndrome of bilateral lacrimal glands: Secondary | ICD-10-CM | POA: Diagnosis not present

## 2021-12-17 ENCOUNTER — Other Ambulatory Visit: Payer: Self-pay | Admitting: Family Medicine

## 2021-12-17 DIAGNOSIS — I999 Unspecified disorder of circulatory system: Secondary | ICD-10-CM | POA: Diagnosis not present

## 2021-12-17 DIAGNOSIS — E039 Hypothyroidism, unspecified: Secondary | ICD-10-CM | POA: Diagnosis not present

## 2021-12-17 DIAGNOSIS — I10 Essential (primary) hypertension: Secondary | ICD-10-CM | POA: Diagnosis not present

## 2021-12-17 DIAGNOSIS — R251 Tremor, unspecified: Secondary | ICD-10-CM | POA: Diagnosis not present

## 2021-12-17 DIAGNOSIS — E7800 Pure hypercholesterolemia, unspecified: Secondary | ICD-10-CM | POA: Diagnosis not present

## 2021-12-17 DIAGNOSIS — R609 Edema, unspecified: Secondary | ICD-10-CM | POA: Diagnosis not present

## 2021-12-17 DIAGNOSIS — R208 Other disturbances of skin sensation: Secondary | ICD-10-CM | POA: Diagnosis not present

## 2021-12-23 ENCOUNTER — Ambulatory Visit
Admission: RE | Admit: 2021-12-23 | Discharge: 2021-12-23 | Disposition: A | Discharge status: Home / Self Care | Payer: Medicare Other | Source: Ambulatory Visit | Attending: Family Medicine | Admitting: Family Medicine

## 2021-12-23 DIAGNOSIS — E785 Hyperlipidemia, unspecified: Secondary | ICD-10-CM | POA: Diagnosis not present

## 2021-12-23 DIAGNOSIS — I999 Unspecified disorder of circulatory system: Secondary | ICD-10-CM

## 2021-12-23 DIAGNOSIS — I10 Essential (primary) hypertension: Secondary | ICD-10-CM | POA: Diagnosis not present

## 2022-01-06 DIAGNOSIS — R200 Anesthesia of skin: Secondary | ICD-10-CM | POA: Diagnosis not present

## 2022-01-06 DIAGNOSIS — M5416 Radiculopathy, lumbar region: Secondary | ICD-10-CM | POA: Diagnosis not present

## 2022-06-17 DIAGNOSIS — E039 Hypothyroidism, unspecified: Secondary | ICD-10-CM | POA: Diagnosis not present

## 2022-06-17 DIAGNOSIS — Z23 Encounter for immunization: Secondary | ICD-10-CM | POA: Diagnosis not present

## 2022-06-17 DIAGNOSIS — Z136 Encounter for screening for cardiovascular disorders: Secondary | ICD-10-CM | POA: Diagnosis not present

## 2022-06-17 DIAGNOSIS — R251 Tremor, unspecified: Secondary | ICD-10-CM | POA: Diagnosis not present

## 2022-06-17 DIAGNOSIS — E7800 Pure hypercholesterolemia, unspecified: Secondary | ICD-10-CM | POA: Diagnosis not present

## 2022-06-17 DIAGNOSIS — Z0000 Encounter for general adult medical examination without abnormal findings: Secondary | ICD-10-CM | POA: Diagnosis not present

## 2022-06-17 DIAGNOSIS — R3912 Poor urinary stream: Secondary | ICD-10-CM | POA: Diagnosis not present

## 2022-06-17 DIAGNOSIS — K5140 Inflammatory polyps of colon without complications: Secondary | ICD-10-CM | POA: Diagnosis not present

## 2022-06-17 DIAGNOSIS — I10 Essential (primary) hypertension: Secondary | ICD-10-CM | POA: Diagnosis not present

## 2022-06-18 ENCOUNTER — Other Ambulatory Visit: Payer: Self-pay | Admitting: Family Medicine

## 2022-06-18 DIAGNOSIS — Z87891 Personal history of nicotine dependence: Secondary | ICD-10-CM

## 2022-06-18 DIAGNOSIS — Z136 Encounter for screening for cardiovascular disorders: Secondary | ICD-10-CM

## 2022-07-14 ENCOUNTER — Ambulatory Visit
Admission: RE | Admit: 2022-07-14 | Discharge: 2022-07-14 | Disposition: A | Discharge status: Home / Self Care | Payer: 59 | Source: Ambulatory Visit | Attending: Family Medicine | Admitting: Family Medicine

## 2022-07-14 DIAGNOSIS — Z87891 Personal history of nicotine dependence: Secondary | ICD-10-CM | POA: Diagnosis not present

## 2022-07-14 DIAGNOSIS — Z136 Encounter for screening for cardiovascular disorders: Secondary | ICD-10-CM

## 2022-07-20 ENCOUNTER — Other Ambulatory Visit: Payer: Self-pay | Admitting: Family Medicine

## 2022-07-20 DIAGNOSIS — R7309 Other abnormal glucose: Secondary | ICD-10-CM | POA: Diagnosis not present

## 2022-07-20 DIAGNOSIS — R3912 Poor urinary stream: Secondary | ICD-10-CM | POA: Diagnosis not present

## 2022-07-20 DIAGNOSIS — R94131 Abnormal electromyogram [EMG]: Secondary | ICD-10-CM

## 2022-07-20 DIAGNOSIS — E7800 Pure hypercholesterolemia, unspecified: Secondary | ICD-10-CM | POA: Diagnosis not present

## 2022-08-03 ENCOUNTER — Ambulatory Visit
Admission: RE | Admit: 2022-08-03 | Discharge: 2022-08-03 | Disposition: A | Discharge status: Home / Self Care | Payer: 59 | Source: Ambulatory Visit | Attending: Family Medicine | Admitting: Family Medicine

## 2022-08-03 DIAGNOSIS — R94131 Abnormal electromyogram [EMG]: Secondary | ICD-10-CM

## 2022-08-03 DIAGNOSIS — M545 Low back pain, unspecified: Secondary | ICD-10-CM | POA: Diagnosis not present

## 2022-08-19 DIAGNOSIS — I10 Essential (primary) hypertension: Secondary | ICD-10-CM | POA: Diagnosis not present

## 2022-08-19 DIAGNOSIS — E039 Hypothyroidism, unspecified: Secondary | ICD-10-CM | POA: Diagnosis not present

## 2022-08-19 DIAGNOSIS — R7309 Other abnormal glucose: Secondary | ICD-10-CM | POA: Diagnosis not present

## 2022-08-19 DIAGNOSIS — E7800 Pure hypercholesterolemia, unspecified: Secondary | ICD-10-CM | POA: Diagnosis not present

## 2022-08-25 DIAGNOSIS — M5451 Vertebrogenic low back pain: Secondary | ICD-10-CM | POA: Diagnosis not present

## 2022-08-31 DIAGNOSIS — M5451 Vertebrogenic low back pain: Secondary | ICD-10-CM | POA: Diagnosis not present

## 2022-09-04 DIAGNOSIS — M5451 Vertebrogenic low back pain: Secondary | ICD-10-CM | POA: Diagnosis not present

## 2022-09-07 DIAGNOSIS — M5451 Vertebrogenic low back pain: Secondary | ICD-10-CM | POA: Diagnosis not present

## 2022-12-08 DIAGNOSIS — H5203 Hypermetropia, bilateral: Secondary | ICD-10-CM | POA: Diagnosis not present

## 2022-12-08 DIAGNOSIS — H2513 Age-related nuclear cataract, bilateral: Secondary | ICD-10-CM | POA: Diagnosis not present

## 2022-12-22 DIAGNOSIS — R7309 Other abnormal glucose: Secondary | ICD-10-CM | POA: Diagnosis not present

## 2022-12-22 DIAGNOSIS — E7800 Pure hypercholesterolemia, unspecified: Secondary | ICD-10-CM | POA: Diagnosis not present

## 2022-12-22 DIAGNOSIS — I10 Essential (primary) hypertension: Secondary | ICD-10-CM | POA: Diagnosis not present

## 2022-12-22 DIAGNOSIS — E039 Hypothyroidism, unspecified: Secondary | ICD-10-CM | POA: Diagnosis not present

## 2022-12-22 DIAGNOSIS — R251 Tremor, unspecified: Secondary | ICD-10-CM | POA: Diagnosis not present

## 2023-02-23 DIAGNOSIS — Z23 Encounter for immunization: Secondary | ICD-10-CM | POA: Diagnosis not present

## 2023-04-26 DIAGNOSIS — Z23 Encounter for immunization: Secondary | ICD-10-CM | POA: Diagnosis not present
